# Patient Record
Sex: Female | Born: 1985
Health system: Southern US, Community
[De-identification: ages and names within clinical notes are randomized; demographics above are authoritative.]

## PROBLEM LIST (undated history)

## (undated) DIAGNOSIS — F840 Autistic disorder: Secondary | ICD-10-CM

## (undated) HISTORY — PX: WISDOM TOOTH EXTRACTION: SHX21

## (undated) HISTORY — DX: Autistic disorder: F84.0

---
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
## 2015-12-17 DIAGNOSIS — F840 Autistic disorder: Secondary | ICD-10-CM | POA: Diagnosis not present

## 2015-12-17 DIAGNOSIS — F900 Attention-deficit hyperactivity disorder, predominantly inattentive type: Secondary | ICD-10-CM | POA: Diagnosis not present

## 2016-06-09 DIAGNOSIS — F900 Attention-deficit hyperactivity disorder, predominantly inattentive type: Secondary | ICD-10-CM | POA: Diagnosis not present

## 2016-12-01 DIAGNOSIS — F900 Attention-deficit hyperactivity disorder, predominantly inattentive type: Secondary | ICD-10-CM | POA: Diagnosis not present

## 2017-02-02 DIAGNOSIS — H35413 Lattice degeneration of retina, bilateral: Secondary | ICD-10-CM | POA: Diagnosis not present

## 2017-02-28 DIAGNOSIS — R636 Underweight: Secondary | ICD-10-CM | POA: Diagnosis not present

## 2017-02-28 DIAGNOSIS — F840 Autistic disorder: Secondary | ICD-10-CM | POA: Diagnosis not present

## 2017-02-28 DIAGNOSIS — Z23 Encounter for immunization: Secondary | ICD-10-CM | POA: Diagnosis not present

## 2017-05-04 ENCOUNTER — Ambulatory Visit: Payer: BLUE CROSS/BLUE SHIELD | Admitting: Dietician

## 2017-05-11 ENCOUNTER — Encounter: Payer: BLUE CROSS/BLUE SHIELD | Attending: Family Medicine | Admitting: Dietician

## 2017-05-11 DIAGNOSIS — F840 Autistic disorder: Secondary | ICD-10-CM | POA: Diagnosis not present

## 2017-05-11 DIAGNOSIS — R636 Underweight: Secondary | ICD-10-CM | POA: Insufficient documentation

## 2017-05-11 DIAGNOSIS — Z713 Dietary counseling and surveillance: Secondary | ICD-10-CM | POA: Diagnosis not present

## 2017-05-11 NOTE — Progress Notes (Signed)
Medical Nutrition Therapy:  Appt start time: 1500 end time:  1600.   Assessment:  Primary concerns today: Patient is here today with her mother Regina Vega.  She was referred for underweight.  History includes Autism and possible ADD.  Per MD notes, patient may unconsiously restrict her food intake and feels that she cannot eat starches.  In interview with her patient and mother, there are many foods that Regina Vega has sensitivities to or dislikes due to taste or texture.  These are listed under the Dietary intake section.  States that she eats to get her blood sugar up.  Regina Vega generally eats 1 good meal a day and otherwise snacks.  She seems to have a lot of food rules.  "They don't go together in my body."   Specifics also listed under the dietary intake section of this note.  She sleeps from midnight to 8 am or noon depending on what she is doing.  Appetite may be reduced from the Adderall as well.   Today's weight:  90 lbs (02/28/17- 91.4 lbs).  UBW 90-100 lbs Highest weight 115 lbs.  Patient lives with her mother, father, and brother.  Mom does most of the shopping and parents share cooking.  No specific physical activity.  She walks some.  She is currently going to college part time at [employer] grad school in physics.  Preferred Learning Style:   No preference indicated   Learning Readiness:   Contemplating   MEDICATIONS: see list to include Adderall on days that she goes to college   DIETARY INTAKE:  Sensory issues (gag or odor) for raw tomatoes, peppers, eggplant (smell), cooked carrots, broccoli, beets, cumin, combination of eggs and vinegar, blueberries, many canned, cooked or dried fruits and some foods with strong contrasts such as walnuts in sweets because walnuts are bitter).  "Too many grains cause gas pains and diarrhea from starch"  (rice, potatoes, cornstarch, bread, quinoa,- tolerates bread better than rice), smoothies taste like over-ripe fruit.  Patient repeatedly stated  that "gluten was not the problem".  Mother brought in a list of foods that she eats which includes onions, garlic, spinach, brown rice, quinoa, wild rice all in small quantities, pinapple, eggs, milk, mozerella chees, triscuits, red kidney beans or tofu mashed with flavorings, oranges, pecans, noodles, soy sauce, lemon juice, coconut milk, some chicken, [place] (ground), beef (ground), smooth peanut butter, various vinegars, olive oil, and various spices.  24-hr recall:  B ( AM): "candy or two to get my blood sugar up"-"I don't like to do this usually but hard to get a good balance." OR triscuits  Snk ( AM): candy, peanut butter, cheese, milk, pop sickles, triscuits L ( PM):  snacks Snk ( PM): snacks D ( PM): chicken or salmon, tofu, ground beef or [place], or chick peas or red beans and rice, few vegetables Snk ( PM): snacks Beverages: 2% milk (1-4 cups daily), small amounts of water, occasional juice  Usual physical activity: walking  Estimated energy needs: 2200 calories 60 g protein   Progress Towards Goal(s):  In progress.   Nutritional Diagnosis:  [place]-3.1 Underweight As related to autism food sensitivities j.  As evidenced by diet hx and BMI of 14.    Intervention:  Nutrition counseling/education to maximize nutritional intake.  Discussed adequate hydration and meal schedule.  Discussed meal options as well as supplements to try.  Patient with some resistance.  Aim for 8 cups (64 ounces) fluid daily. Breakfast, lunch, dinner, and 3 snacks daily.  Breakfast or snack ideas:  -Banana bread, whole milk  -Poppyseed muffin, whole milk  -Eggs, cheese (saute onions, tomato sauce, spinach), with avocado  -Yogurt (try to find whole milk yogurt)-lemon, toast with peanut butter  -Triscuits and cheese, fresh fruit  -Banana shake (whole milk, banana, ?peanut butter)  -Whole milk yogurt, pineapple and pecans  Smoothie, Ensure, Boost, other shake.     Teaching Method Utilized:   Visual Auditory Hands on   Barriers to learning/adherence to lifestyle change: autism  Demonstrated degree of understanding via:  Teach Back   Monitoring/Evaluation:  Dietary intake, exercise, and body weight in 4 week(s).

## 2017-05-11 NOTE — Patient Instructions (Addendum)
Aim for 8 cups (64 ounces) fluid daily. Breakfast, lunch, dinner, and 3 snacks daily.  Breakfast or snack ideas:  -Banana bread, whole milk  -Poppyseed muffin, whole milk  -Eggs, cheese (saute onions, tomato sauce, spinach), with avocado  -Yogurt (try to find whole milk yogurt)-lemon, toast with peanut butter  -Triscuits and cheese, fresh fruit  -Banana shake (whole milk, banana, ?peanut butter)  -Whole milk yogurt, pineapple and pecans  Smoothie, Ensure, Boost, other shake.

## 2017-05-30 DIAGNOSIS — Z131 Encounter for screening for diabetes mellitus: Secondary | ICD-10-CM | POA: Diagnosis not present

## 2017-05-30 DIAGNOSIS — Z0000 Encounter for general adult medical examination without abnormal findings: Secondary | ICD-10-CM | POA: Diagnosis not present

## 2017-05-30 DIAGNOSIS — F840 Autistic disorder: Secondary | ICD-10-CM | POA: Diagnosis not present

## 2017-05-30 DIAGNOSIS — Z13220 Encounter for screening for lipoid disorders: Secondary | ICD-10-CM | POA: Diagnosis not present

## 2017-06-08 ENCOUNTER — Ambulatory Visit: Payer: BLUE CROSS/BLUE SHIELD | Admitting: Dietician

## 2017-06-13 ENCOUNTER — Other Ambulatory Visit (HOSPITAL_COMMUNITY)
Admission: RE | Admit: 2017-06-13 | Discharge: 2017-06-13 | Disposition: A | Discharge status: Home / Self Care | Payer: BLUE CROSS/BLUE SHIELD | Source: Ambulatory Visit | Attending: Family Medicine | Admitting: Family Medicine

## 2017-06-13 ENCOUNTER — Other Ambulatory Visit: Payer: Self-pay | Admitting: Family Medicine

## 2017-06-13 DIAGNOSIS — F840 Autistic disorder: Secondary | ICD-10-CM | POA: Diagnosis not present

## 2017-06-13 DIAGNOSIS — Z124 Encounter for screening for malignant neoplasm of cervix: Secondary | ICD-10-CM | POA: Insufficient documentation

## 2017-06-15 ENCOUNTER — Encounter: Payer: BLUE CROSS/BLUE SHIELD | Attending: Family Medicine | Admitting: Dietician

## 2017-06-15 DIAGNOSIS — F840 Autistic disorder: Secondary | ICD-10-CM | POA: Insufficient documentation

## 2017-06-15 DIAGNOSIS — R636 Underweight: Secondary | ICD-10-CM | POA: Insufficient documentation

## 2017-06-15 DIAGNOSIS — Z713 Dietary counseling and surveillance: Secondary | ICD-10-CM | POA: Insufficient documentation

## 2017-06-15 LAB — CYTOLOGY - PAP
Diagnosis: NEGATIVE
HPV: NOT DETECTED

## 2017-06-15 NOTE — Patient Instructions (Signed)
Aim for 8 cups (64 ounces) fluid daily. Breakfast, lunch, dinner, and 3 snacks daily.             Breakfast or snack ideas:             -Banana bread, whole milk             -Poppyseed muffin, whole milk             -Eggs, cheese (saute onions, tomato sauce, spinach), with avocado             -Yogurt (try to find whole milk yogurt)-lemon, toast with peanut butter             -Triscuits and cheese, fresh fruit             -Banana shake (whole milk, banana, ?peanut butter)             -Whole milk yogurt, pineapple and pecans  -Quiche  Try a supplement and have 2-3 daily  Boost  Ensure  [employer] Breakfast Essentials  Consider eating every 2-3 hours (snack or meal)  Avocado pudding ([place] [email or web address])

## 2017-06-15 NOTE — Progress Notes (Signed)
Medical Nutrition Therapy:  Appt start time: 1515 end time:  1550.   Assessment:  Primary concerns today: Patient is here today alone with her mother Karen KitchensBobbie for follow up for underweight.  Her weight is unchanged.  They have experimented some with new foods but remains a challenge.  She has drank the whole milk and likes this but she reports being full for longer.  She continues to report a problem with starch utilization.  Still eating to get her blood sugar up.  States that peanut butter and nuts make her blood sugar crash.  She continues on Adderall but denies that this is negatively effecting her appetite.  She takes this at about noon on days that she has class.  Continues to eat 1 good meal per day plus snacks.   Today's weight:  90 lbs (02/28/17- 91.4 lbs).  UBW 90-100 lbs Highest weight 115 lbs.  Patient lives with her mother, father, and brother.  Mom does most of the shopping and parents share cooking.  No specific physical activity.  She walks some.  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  Preferred Learning Style:   No preference indicated   Learning Readiness:   Ready  Change in progress   MEDICATIONS: see list to include Adderall on days that she goes to college   DIETARY INTAKE: Sensory issues (gag or odor) for raw tomatoes, peppers, eggplant (smell), cooked carrots, broccoli, beets, cumin, combination of eggs and vinegar, blueberries, many canned, cooked or dried fruits and some foods with strong contrasts such as walnuts in sweets because walnuts are bitter).  "Too many grains cause gas pains and diarrhea from starch"  (rice, potatoes, cornstarch, bread, quinoa,- tolerates bread better than rice), smoothies taste like over-ripe fruit.  Patient repeatedly stated that "gluten was not the problem".  Mother brought in a list of foods that she eats which includes onions, garlic, spinach, brown rice, quinoa, wild rice all in small  quantities, pinapple, eggs, milk, mozerella chees, triscuits, red kidney beans or tofu mashed with flavorings, oranges, pecans, noodles, soy sauce, lemon juice, coconut milk, some chicken, [place] (ground), beef (ground), smooth peanut butter, various vinegars, olive oil, and various spices.  24-hr recall: see recall from 12/27 visit   Usual physical activity: walking  Estimated energy needs: 2200 calories 60-75 g protein  Progress Towards Goal(s):  In progress.   Nutritional Diagnosis:  [place]-3.1 Underweight As related to autism food sensitivities.  As evidenced by diet history and BMI of 14.    Intervention:  Nutrition counseling/education continued to maximize nutritional intake. Again discussed importance of adequate hydration and meal schedule.  Maralyn SagoSarah states that she does not like schedules.  She often uses the word dubious (doesn't know enough about it).  Gave a sample shake supplement for her to try. She was more relaxed at this visit and willing to discuss ideas for further meal plans and snacks.  Further discussed her food intolerances and questioned whether evaluation of these with further testing would be helpful.  Messaged another RD about this that deals with food sensitivities.  Aim for 8 cups (64 ounces) fluid daily. Breakfast, lunch, dinner, and 3 snacks daily.             Breakfast or snack ideas:             -Banana bread, whole milk             -Poppyseed muffin, whole milk             -  Eggs, cheese (saute onions, tomato sauce, spinach), with avocado             -Yogurt (try to find whole milk yogurt)-lemon, toast with peanut butter             -Triscuits and cheese, fresh fruit             -Banana shake (whole milk, banana, ?peanut butter)             -Whole milk yogurt, pineapple and pecans  -Quiche  Try a supplement and have 2-3 daily  Boost  Ensure  [employer] Essentials  Consider eating every 2-3 hours (snack or meal)  Avocado pudding ([place]  [email or web address])  Teaching Method Utilized:  Visual Auditory Hands on  Handouts given during visit include:  Snack list  Barriers to learning/adherence to lifestyle change: autism  Demonstrated degree of understanding via:  Teach Back   Monitoring/Evaluation:  Dietary intake, exercise, and body weight in 2 month(s).

## 2017-07-13 DIAGNOSIS — F900 Attention-deficit hyperactivity disorder, predominantly inattentive type: Secondary | ICD-10-CM | POA: Diagnosis not present

## 2017-08-03 ENCOUNTER — Telehealth: Payer: Self-pay | Admitting: Dietician

## 2017-08-03 NOTE — Telephone Encounter (Signed)
Left message on home answering service related to requested appointment change per mother's request. Appointment next week has been cancelled. New appointment May 30th at 4:00.  Oran ReinLaura Sharonica Kraszewski, RD, LDN, CDE

## 2017-08-10 ENCOUNTER — Ambulatory Visit: Payer: BLUE CROSS/BLUE SHIELD | Admitting: Dietician

## 2017-10-12 ENCOUNTER — Encounter: Payer: BLUE CROSS/BLUE SHIELD | Attending: Family Medicine | Admitting: Dietician

## 2017-10-12 DIAGNOSIS — E639 Nutritional deficiency, unspecified: Secondary | ICD-10-CM | POA: Diagnosis not present

## 2017-10-12 DIAGNOSIS — Z713 Dietary counseling and surveillance: Secondary | ICD-10-CM | POA: Diagnosis not present

## 2017-10-12 DIAGNOSIS — R636 Underweight: Secondary | ICD-10-CM

## 2017-10-12 NOTE — Patient Instructions (Addendum)
Continue to experiment. Great adding breakfast! Add snacks between breakfast and lunch   Kind Protein bars  [employer], date, honey bars  Granola Bar  ([facility] Honey)  Spanikopita ([employer])  Spinach dip or hummus and carrots or pita chips or corn chips.  Banana bread, pumpkin bread, lemon poppyseed muffin etc. Continue the Carnation Breakfast in milk.  Aim for 1 packet per day.  Breakfast  Afternoon snack (1-2)  Before bed Aim for 8 cups fluid (64 ounces) daily.

## 2017-10-13 ENCOUNTER — Encounter: Payer: Self-pay | Admitting: Dietician

## 2017-10-13 NOTE — Progress Notes (Signed)
Medical Nutrition Therapy:  Appt start time: 1600 end time:  1630.   Assessment:  Primary concerns today: Patient is here today with her mother Karen Kitchens for follow up for underweight.  Her weight has increased 2 lbs since her last visit 06/15/17.  They have continued to experiment with foods and have found a few more items that she is willing to eat.  She eats well for dinner which is generally around 9 pm.  She now eating a little better at breakfast and has found a few snacks that she is willing to eat.  She will also drink 1/3 package Carnation Breakfast in 8 ounces of whole milk.  She reports continued intolerance (diarrhea) when she eats too many grains and reports that she tolerates wheat best.  She takes Adderall 3 days per week when she attends college and does not notice any effect on her appetite but while at school, food is less available and she does not bring anything or minimal.    Today's weight 92 lbs (90 lbs 06/15/17).   UBW 90-100 lbs Highest weight 115 lbs.  Patient lives with her mother, father, and brother who also has autism.  Mom does most of the shopping and parents share cooking.  No specific physical activity although she walks some.  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    Preferred Learning Style:   No preference indicated   Learning Readiness:   Ready  Change in progress   MEDICATIONS: Adderall 3 days per week.   DIETARY INTAKE: Sensory issues (gag or odor) for raw tomatoes, peppers, eggplant (smell), cooked carrots, broccoli, beets, cumin, combination of eggs and vinegar, blueberries, many canned, cooked or dried fruits and some foods with strong contrasts such as walnuts in sweets because walnuts are bitter). "Too many grains cause gas pains and diarrhea from starch" (rice, potatoes, cornstarch, bread, quinoa,- tolerates bread better than rice), smoothies taste like over-ripe fruit. Patient repeatedly stated that  "gluten was not the problem".  Mother brought in a list of foods that she eats which includes onions, garlic, spinach, brown rice, quinoa, wild rice all in small quantities, pinapple, eggs, milk, mozerella chees, triscuits, red kidney beans or tofu mashed with flavorings, oranges, pecans, noodles, soy sauce, lemon juice, coconut milk, some chicken, [place] (ground), beef (ground), smooth peanut butter, various vinegars, olive oil, and various spices.  She eats very slowly (picks little parts of food apart).  Complains of early satiety.  If she eats too quickly or too much, she complains of not feeling well.  24-hr recall:  B ( AM): 1/4 or a single serving quiche and 8 ounces whole milk with 1/3 package of [employer] (states that she gets full from this and is unable to eat more).    Snk ( AM): [employer] or seed, date nut bar, or banana or pumpkin bread or smoothie, or granola bar L ( PM): none Snk ( PM): snacks such as am and 8 ounces milk with Carnation Breakfast (1/3 pack).  Candy D ( PM): Meat, milk, vegetables (mom and patient state dinner is her best meal and she eats this well Snk ( PM): none Beverages: whole milk with Carnation Breakfast (1-4 cups daily), small amounts of water, occasional juice  Usual physical activity: walking  Estimated energy needs: 2200 calories 60 g protein  Progress Towards Goal(s):  In progress.   Nutritional Diagnosis:  -3.1 Underweight As related to Autism food sensitivities.  As evidenced by BMI of 14.    Intervention:  Nutrition counseling/education to maximize nutritional intake.  Continued to discuss nutritional needs and frequent eating of high nutritional quality.  Continued to discuss meal/snack ideas.  Previously discussed patient with another RD that stated food tests regarding sensitivity would not be recommended at this time.  Continue to experiment. Great adding breakfast! Add snacks between breakfast and lunch   Kind Protein  bars  [employer], date, honey bars  Granola Bar  ([facility] Honey)  Spanikopita ([employer])  Spinach dip or hummus and carrots or pita chips or corn chips.  Banana bread, pumpkin bread, lemon poppyseed muffin etc. Continue the Carnation Breakfast in milk.  Aim for 1 packet per day.  Breakfast  Afternoon snack (1-2)  Before bed Aim for 8 cups fluid (64 ounces) daily.  Teaching Method Utilized:  Visual Auditory  Barriers to learning/adherence to lifestyle change: autism  Demonstrated degree of understanding via:  Teach Back   Monitoring/Evaluation:  Dietary intake, exercise, and body weight in 3 month(s).

## 2018-01-04 DIAGNOSIS — F900 Attention-deficit hyperactivity disorder, predominantly inattentive type: Secondary | ICD-10-CM | POA: Diagnosis not present

## 2018-01-04 DIAGNOSIS — F840 Autistic disorder: Secondary | ICD-10-CM | POA: Diagnosis not present

## 2018-01-25 ENCOUNTER — Encounter: Payer: BLUE CROSS/BLUE SHIELD | Attending: Family Medicine | Admitting: Dietician

## 2018-01-25 DIAGNOSIS — Z713 Dietary counseling and surveillance: Secondary | ICD-10-CM | POA: Insufficient documentation

## 2018-01-25 DIAGNOSIS — R636 Underweight: Secondary | ICD-10-CM | POA: Diagnosis not present

## 2018-01-25 NOTE — Patient Instructions (Signed)
Have several meals and snacks per day. Continue to challenge yourself to eat a little more Be sure to eat even when not hungry.  Your body needs nutrition Try whole milk yogurt Consider going to the bulk section of a store and looking at the snacks and trying new things (banana chips, sesame sticks, yogurt covered items etc.)

## 2018-01-25 NOTE — Progress Notes (Signed)
Medical Nutrition Therapy:  Appt start time: 1630 end time:  1700.   Assessment:  Primary concerns today: Patient is here today with her mother Regina Vega.  She was last seen in May.  Her weight continues to be unchanged at 91 lbs.  Regina Vega is getting bored with food variety.  She continues to eat dinner which is more balanced and Regina Vega agrees to this but struggles with meals at other times of the day and generally eats a very small breakfast and snacks.  She states that she would like something to eat rather than candy to give her energy but struggles to find something that she likes that she feels that she can digest well.  She continues to report diarrhea when she eats too many grains and reports that she tolerates wheat best.  She takes Adderall and continues to attend college 3 days per week and does not bring significant food.  She has stopped drinking the [employer]arn[employer] Breakfast but continues to use the whole milk and also enjoys Belvita Protein Oats Honey and Chocolate.  She states that if the food takes works she will eat less of it.  She looks into the refrigerator and only sees what she does not like.  She wants to gain increased independence but now is asking her mom to help her with meals and snacks and have independence in other areas.    UBW 90-100 lbs Highest weigh 115 lbs  Regina Vega lives with her mother, father, and brother who also has autism.  Mom does most of the shopping and encouraged patient to shop with her.  No specific physical activity although she walks some.  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    Preferred Learning Style:   No preference indicated   Learning Readiness:   Contemplating  Change in progress   DIETARY INTAKE:  24-hr recall:  B ( AM): 1/4 small quiche, whole milk or smoothie   Snk ( AM): quick bread  L ( PM): snack Snk ( PM): snack D ( PM): balanced Snk ( PM): none Beverages: whole milk, smoothie, small  amounts of water, occasional juice  Usual physical activity: walking  Estimated energy needs: 2200 calories 60 g protein  Progress Towards Goal(s):  In progress.   Nutritional Diagnosis:  [place]-3.1 Underweight As related to Autism/ food intolerances and dislikes.  As evidenced by BMI 14.    Intervention:  Nutrition counseling/education continued.  Readdressed the goals with patient and need to gain weight and improve overall nutrition.  Mom stated need to make sure Regina Vega is healthy.  Reviewed nutrition and physiology and the need to improve.  Discussed food options and foods to increase variety, calories, Discussed several small meals and snacks as patient states that she feels ill after overeating (normal portions).    Have several meals and snacks per day. Continue to challenge yourself to eat a little more Be sure to eat even when not hungry.  Your body needs nutrition Try whole milk yogurt Consider going to the bulk section of a store and looking at the snacks and trying new things (banana chips, sesame sticks, yogurt covered items etc.)  Patient and mom to work on a list of snacks that are available to put on the refrigerator to prompt choices as well as mom to assist more with meals and snacks.  Teaching Method Utilized:  Visual Auditory  Handouts given during visit include:  Meal plan card  Snack list  Barriers to learning/adherence to lifestyle change: autism  Demonstrated degree of understanding via:  Teach Back   Monitoring/Evaluation:  Dietary intake, exercise, and body weight in 2 month(s).

## 2018-02-15 DIAGNOSIS — H35413 Lattice degeneration of retina, bilateral: Secondary | ICD-10-CM | POA: Diagnosis not present

## 2018-03-29 ENCOUNTER — Ambulatory Visit: Payer: BLUE CROSS/BLUE SHIELD | Admitting: Dietician

## 2018-04-19 ENCOUNTER — Ambulatory Visit: Payer: BLUE CROSS/BLUE SHIELD | Admitting: Dietician

## 2018-05-07 ENCOUNTER — Ambulatory Visit: Payer: BLUE CROSS/BLUE SHIELD | Admitting: Dietician

## 2018-05-24 DIAGNOSIS — F4320 Adjustment disorder, unspecified: Secondary | ICD-10-CM | POA: Diagnosis not present

## 2018-05-31 ENCOUNTER — Ambulatory Visit: Payer: BLUE CROSS/BLUE SHIELD | Admitting: Dietician

## 2018-05-31 DIAGNOSIS — F4320 Adjustment disorder, unspecified: Secondary | ICD-10-CM | POA: Diagnosis not present

## 2018-06-05 DIAGNOSIS — Z131 Encounter for screening for diabetes mellitus: Secondary | ICD-10-CM | POA: Diagnosis not present

## 2018-06-05 DIAGNOSIS — Z13220 Encounter for screening for lipoid disorders: Secondary | ICD-10-CM | POA: Diagnosis not present

## 2018-06-05 DIAGNOSIS — R636 Underweight: Secondary | ICD-10-CM | POA: Diagnosis not present

## 2018-06-05 DIAGNOSIS — Z0001 Encounter for general adult medical examination with abnormal findings: Secondary | ICD-10-CM | POA: Diagnosis not present

## 2018-06-07 ENCOUNTER — Ambulatory Visit: Payer: BLUE CROSS/BLUE SHIELD | Admitting: Dietician

## 2018-06-07 DIAGNOSIS — F4320 Adjustment disorder, unspecified: Secondary | ICD-10-CM | POA: Diagnosis not present

## 2018-06-14 DIAGNOSIS — F411 Generalized anxiety disorder: Secondary | ICD-10-CM | POA: Diagnosis not present

## 2018-06-21 DIAGNOSIS — F4320 Adjustment disorder, unspecified: Secondary | ICD-10-CM | POA: Diagnosis not present

## 2018-06-28 DIAGNOSIS — M6740 Ganglion, unspecified site: Secondary | ICD-10-CM | POA: Diagnosis not present

## 2018-07-12 DIAGNOSIS — F900 Attention-deficit hyperactivity disorder, predominantly inattentive type: Secondary | ICD-10-CM | POA: Diagnosis not present

## 2018-07-19 DIAGNOSIS — F411 Generalized anxiety disorder: Secondary | ICD-10-CM | POA: Diagnosis not present

## 2018-07-26 DIAGNOSIS — F411 Generalized anxiety disorder: Secondary | ICD-10-CM | POA: Diagnosis not present

## 2018-08-02 ENCOUNTER — Encounter: Payer: BLUE CROSS/BLUE SHIELD | Attending: Endocrinology | Admitting: Dietician

## 2018-08-02 ENCOUNTER — Other Ambulatory Visit: Payer: Self-pay

## 2018-08-02 DIAGNOSIS — R636 Underweight: Secondary | ICD-10-CM

## 2018-08-02 DIAGNOSIS — F411 Generalized anxiety disorder: Secondary | ICD-10-CM | POA: Diagnosis not present

## 2018-08-02 NOTE — Patient Instructions (Signed)
See the list of recipe ideas and tips to increase calories. Ideas include Alfredo sauce, peanut sauce, seasoned nuts. Add some cream to your smoothie.  Several small meals per day. Avoid skipping meals Add butter, oil to foods.

## 2018-08-02 NOTE — Progress Notes (Signed)
Medical Nutrition Therapy:  Appt start time: 1515 end time:  1550.   Assessment:  Primary concerns today: Patient is here today with her mother Karen Kitchens.  She would like to know how to get more fat that is not candy.  She is no longer taking Adderall.  She felt that she no longer needed it and stopped taking this.  She was only taking this on days that she was going to school.  She states that the Adderall tended to make her "blood sugar crash".   Weight has increased 2 lbs to 93.4 lbs today.  She was last seen 6 months ago. UBW 90-100 lbs Highest weight 115 lbs  She is now living in an apartment with her brother and they are preparing meals.  Mom delivers groceries and also prepares food and brings it.  Masen also is working with her Clinical biochemist on cooking.  She is interested in recipes as well (particularly for entrees and side dishes).  Her brother does not want any internet at the apartment so she gets bored often. Currently school is on hold due to [place] Virus restrictions.  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     Preferred Learning Style:   No preference indicated   Learning Readiness:   Contemplating  Change in progress  MEDICATIONS: see list   DIETARY INTAKE:  Usual eating pattern includes 3 meals and 1-3 snacks per day.  24-hr recall:  Sensory issues (gag or odor) for raw tomatoes, peppers, eggplant (smell), cooked carrots, broccoli, beets, cumin, combination of eggs and vinegar, blueberries, many canned, cooked or dried fruits and some foods with strong contrasts such as walnuts in sweets because walnuts are bitter). "Too many grains cause gas pains and diarrhea from starch" (rice, potatoes, cornstarch, bread, quinoa,- tolerates bread better than rice), smoothies taste like over-ripe fruit. Patient repeatedly stated that "gluten was not the problem", mayo  Mother brought in a list of foods that she eats  which includes onions, garlic, spinach, brown rice, quinoa, wild rice all in small quantities, pinapple, eggs, milk, mozerella chees, triscuits, red kidney beans or tofu mashed with flavorings, oranges, pecans, noodles, soy sauce, lemon juice, coconut milk, some chicken, [place] (ground), beef (ground), smooth peanut butter, various vinegars, olive oil, and various spices.  B ( AM): 1/4 small Quiche,   Snk ( AM): occasional banana bread  L ( PM): spanikopita at times or skips Snk ( PM): candy or smoothie cube or date, seed, nut bars D ( PM): working with her autism coach on cooking- Administrator, arts with butter and sugar OR chicken Snk ( PM): fruit pops Beverages: 2 cups whole milk, cran grape juice, dislikes water, smoothie ice cubes  Usual physical activity: walking  Estimated energy needs: 2200 calories 60 g protein  Progress Towards Goal(s):  In progress.   Nutritional Diagnosis:  [place]-3.1 Underweight As related to Autism/food intelerances and dislikes.  As evidenced by diet hx and patient report.    Intervention:  Nutrition counseling/education continued.  Discussed foods high in fat and ideas to incorporate these throughout the day.  Discussed the need to avoid skipping meals and reviewed appropriate meal planning.  Patient continues to eat very small portions and encouraged her to increase quality as able. Will send recipes based on her preferences.  She remains resistant to change but is more willing this visit to incorporate some ideas.  See the list of  recipe ideas and tips to increase calories. Ideas include Alfredo sauce, peanut sauce, seasoned nuts. Add some cream to your smoothie. Several small meals per day. Avoid skipping meals Add butter, oil to foods.  Teaching Method Utilized:  Visual Auditory Hands on  Handouts given during visit include:  High protein, high calorie recipe ideas  Underweight nutrition therapy  Barriers to learning/adherence to lifestyle  change: Autism  Demonstrated degree of understanding via:  Teach Back   Monitoring/Evaluation:  Dietary intake, exercise, and body weight prn.

## 2018-08-03 ENCOUNTER — Encounter: Payer: Self-pay | Admitting: Dietician

## 2018-08-09 DIAGNOSIS — F411 Generalized anxiety disorder: Secondary | ICD-10-CM | POA: Diagnosis not present

## 2018-08-16 DIAGNOSIS — F411 Generalized anxiety disorder: Secondary | ICD-10-CM | POA: Diagnosis not present

## 2018-08-22 DIAGNOSIS — F411 Generalized anxiety disorder: Secondary | ICD-10-CM | POA: Diagnosis not present

## 2018-08-23 DIAGNOSIS — F411 Generalized anxiety disorder: Secondary | ICD-10-CM | POA: Diagnosis not present

## 2018-08-29 DIAGNOSIS — F411 Generalized anxiety disorder: Secondary | ICD-10-CM | POA: Diagnosis not present

## 2018-08-30 DIAGNOSIS — F411 Generalized anxiety disorder: Secondary | ICD-10-CM | POA: Diagnosis not present

## 2018-09-05 DIAGNOSIS — F411 Generalized anxiety disorder: Secondary | ICD-10-CM | POA: Diagnosis not present

## 2018-09-06 DIAGNOSIS — F411 Generalized anxiety disorder: Secondary | ICD-10-CM | POA: Diagnosis not present

## 2018-09-12 DIAGNOSIS — F411 Generalized anxiety disorder: Secondary | ICD-10-CM | POA: Diagnosis not present

## 2018-09-13 DIAGNOSIS — F411 Generalized anxiety disorder: Secondary | ICD-10-CM | POA: Diagnosis not present

## 2018-09-20 DIAGNOSIS — F411 Generalized anxiety disorder: Secondary | ICD-10-CM | POA: Diagnosis not present

## 2018-09-21 DIAGNOSIS — F411 Generalized anxiety disorder: Secondary | ICD-10-CM | POA: Diagnosis not present

## 2018-09-24 DIAGNOSIS — F411 Generalized anxiety disorder: Secondary | ICD-10-CM | POA: Diagnosis not present

## 2018-09-26 DIAGNOSIS — F411 Generalized anxiety disorder: Secondary | ICD-10-CM | POA: Diagnosis not present

## 2018-10-03 DIAGNOSIS — F411 Generalized anxiety disorder: Secondary | ICD-10-CM | POA: Diagnosis not present

## 2018-10-04 DIAGNOSIS — F411 Generalized anxiety disorder: Secondary | ICD-10-CM | POA: Diagnosis not present

## 2018-10-11 DIAGNOSIS — F411 Generalized anxiety disorder: Secondary | ICD-10-CM | POA: Diagnosis not present

## 2018-10-12 DIAGNOSIS — F411 Generalized anxiety disorder: Secondary | ICD-10-CM | POA: Diagnosis not present

## 2018-10-17 DIAGNOSIS — F411 Generalized anxiety disorder: Secondary | ICD-10-CM | POA: Diagnosis not present

## 2018-10-18 DIAGNOSIS — F411 Generalized anxiety disorder: Secondary | ICD-10-CM | POA: Diagnosis not present

## 2018-10-25 DIAGNOSIS — F411 Generalized anxiety disorder: Secondary | ICD-10-CM | POA: Diagnosis not present

## 2018-10-26 DIAGNOSIS — F411 Generalized anxiety disorder: Secondary | ICD-10-CM | POA: Diagnosis not present

## 2018-10-31 DIAGNOSIS — F411 Generalized anxiety disorder: Secondary | ICD-10-CM | POA: Diagnosis not present

## 2018-11-01 DIAGNOSIS — F411 Generalized anxiety disorder: Secondary | ICD-10-CM | POA: Diagnosis not present

## 2018-11-08 DIAGNOSIS — F411 Generalized anxiety disorder: Secondary | ICD-10-CM | POA: Diagnosis not present

## 2018-11-15 DIAGNOSIS — F411 Generalized anxiety disorder: Secondary | ICD-10-CM | POA: Diagnosis not present

## 2018-11-22 DIAGNOSIS — F411 Generalized anxiety disorder: Secondary | ICD-10-CM | POA: Diagnosis not present

## 2018-11-29 DIAGNOSIS — F411 Generalized anxiety disorder: Secondary | ICD-10-CM | POA: Diagnosis not present

## 2018-12-06 DIAGNOSIS — F411 Generalized anxiety disorder: Secondary | ICD-10-CM | POA: Diagnosis not present

## 2018-12-13 DIAGNOSIS — F411 Generalized anxiety disorder: Secondary | ICD-10-CM | POA: Diagnosis not present

## 2018-12-27 DIAGNOSIS — F411 Generalized anxiety disorder: Secondary | ICD-10-CM | POA: Diagnosis not present

## 2019-01-03 DIAGNOSIS — F411 Generalized anxiety disorder: Secondary | ICD-10-CM | POA: Diagnosis not present

## 2019-01-10 DIAGNOSIS — F411 Generalized anxiety disorder: Secondary | ICD-10-CM | POA: Diagnosis not present

## 2019-02-07 DIAGNOSIS — F411 Generalized anxiety disorder: Secondary | ICD-10-CM | POA: Diagnosis not present

## 2019-02-14 DIAGNOSIS — F411 Generalized anxiety disorder: Secondary | ICD-10-CM | POA: Diagnosis not present

## 2019-02-21 DIAGNOSIS — H35413 Lattice degeneration of retina, bilateral: Secondary | ICD-10-CM | POA: Diagnosis not present

## 2019-02-28 DIAGNOSIS — F411 Generalized anxiety disorder: Secondary | ICD-10-CM | POA: Diagnosis not present

## 2019-03-08 ENCOUNTER — Encounter (HOSPITAL_COMMUNITY): Payer: Self-pay

## 2019-03-08 ENCOUNTER — Other Ambulatory Visit: Payer: Self-pay

## 2019-03-08 DIAGNOSIS — R1031 Right lower quadrant pain: Secondary | ICD-10-CM | POA: Diagnosis not present

## 2019-03-08 DIAGNOSIS — R109 Unspecified abdominal pain: Secondary | ICD-10-CM | POA: Diagnosis not present

## 2019-03-08 DIAGNOSIS — N83291 Other ovarian cyst, right side: Secondary | ICD-10-CM | POA: Diagnosis not present

## 2019-03-08 DIAGNOSIS — R112 Nausea with vomiting, unspecified: Secondary | ICD-10-CM | POA: Diagnosis not present

## 2019-03-08 DIAGNOSIS — R1110 Vomiting, unspecified: Secondary | ICD-10-CM | POA: Diagnosis not present

## 2019-03-08 DIAGNOSIS — N83201 Unspecified ovarian cyst, right side: Secondary | ICD-10-CM | POA: Diagnosis not present

## 2019-03-08 NOTE — ED Triage Notes (Addendum)
Pt coming from home c/o right lower abdominal pain that is constant. 2 ibuprofen taken. N but not V.  Has appendix. Seen at [facility] and told to come if pain get worse. Pt also have difficulty with telling when she needs to urinate. Blood and urine collected at [facility]

## 2019-03-09 ENCOUNTER — Encounter (HOSPITAL_COMMUNITY): Payer: Self-pay

## 2019-03-09 ENCOUNTER — Emergency Department (HOSPITAL_COMMUNITY): Payer: BC Managed Care – PPO

## 2019-03-09 ENCOUNTER — Emergency Department (HOSPITAL_COMMUNITY)
Admission: EM | Admit: 2019-03-09 | Discharge: 2019-03-09 | Disposition: A | Discharge status: Home / Self Care | Payer: BC Managed Care – PPO | Attending: Emergency Medicine | Admitting: Emergency Medicine

## 2019-03-09 DIAGNOSIS — R1031 Right lower quadrant pain: Secondary | ICD-10-CM | POA: Diagnosis not present

## 2019-03-09 DIAGNOSIS — R1110 Vomiting, unspecified: Secondary | ICD-10-CM | POA: Diagnosis not present

## 2019-03-09 DIAGNOSIS — N83291 Other ovarian cyst, right side: Secondary | ICD-10-CM | POA: Diagnosis not present

## 2019-03-09 DIAGNOSIS — R112 Nausea with vomiting, unspecified: Secondary | ICD-10-CM | POA: Diagnosis not present

## 2019-03-09 DIAGNOSIS — N83201 Unspecified ovarian cyst, right side: Secondary | ICD-10-CM

## 2019-03-09 LAB — URINALYSIS, ROUTINE W REFLEX MICROSCOPIC
Bilirubin Urine: NEGATIVE
Glucose, UA: NEGATIVE mg/dL
Hgb urine dipstick: NEGATIVE
Ketones, ur: 20 mg/dL — AB
Nitrite: NEGATIVE
Protein, ur: NEGATIVE mg/dL
Specific Gravity, Urine: 1.028 (ref 1.005–1.030)
pH: 5 (ref 5.0–8.0)

## 2019-03-09 LAB — I-STAT CHEM 8, ED
BUN: 14 mg/dL (ref 6–20)
Calcium, Ion: 1.28 mmol/L (ref 1.15–1.40)
Chloride: 107 mmol/L (ref 98–111)
Creatinine, Ser: 0.5 mg/dL (ref 0.44–1.00)
Glucose, Bld: 75 mg/dL (ref 70–99)
HCT: 38 % (ref 36.0–46.0)
Hemoglobin: 12.9 g/dL (ref 12.0–15.0)
Potassium: 3.4 mmol/L — ABNORMAL LOW (ref 3.5–5.1)
Sodium: 142 mmol/L (ref 135–145)
TCO2: 24 mmol/L (ref 22–32)

## 2019-03-09 LAB — I-STAT BETA HCG BLOOD, ED (MC, WL, AP ONLY): I-stat hCG, quantitative: <5 m[IU]/mL (ref <5)

## 2019-03-09 LAB — CBC WITH DIFFERENTIAL/PLATELET
Abs Immature Granulocytes: 0.02 10*3/uL (ref 0.00–0.07)
Basophils Absolute: 0.1 10*3/uL (ref 0.0–0.1)
Basophils Relative: 1 %
Eosinophils Absolute: 0.1 10*3/uL (ref 0.0–0.5)
Eosinophils Relative: 1 %
HCT: 37 % (ref 36.0–46.0)
Hemoglobin: 11.5 g/dL — ABNORMAL LOW (ref 12.0–15.0)
Immature Granulocytes: 0 %
Lymphocytes Relative: 43 %
Lymphs Abs: 3.1 10*3/uL (ref 0.7–4.0)
MCH: 26.6 pg (ref 26.0–34.0)
MCHC: 31.1 g/dL (ref 30.0–36.0)
MCV: 85.6 fL (ref 80.0–100.0)
Monocytes Absolute: 0.7 10*3/uL (ref 0.1–1.0)
Monocytes Relative: 9 %
Neutro Abs: 3.3 10*3/uL (ref 1.7–7.7)
Neutrophils Relative %: 46 %
Platelets: 220 10*3/uL (ref 150–400)
RBC: 4.32 MIL/uL (ref 3.87–5.11)
RDW: 13.7 % (ref 11.5–15.5)
WBC: 7.2 10*3/uL (ref 4.0–10.5)
nRBC: 0 % (ref 0.0–0.2)

## 2019-03-09 LAB — HEPATIC FUNCTION PANEL
ALT: 12 U/L (ref 0–44)
AST: 13 U/L — ABNORMAL LOW (ref 15–41)
Albumin: 4.8 g/dL (ref 3.5–5.0)
Alkaline Phosphatase: 47 U/L (ref 38–126)
Bilirubin, Direct: 0.1 mg/dL (ref 0.0–0.2)
Indirect Bilirubin: 0.6 mg/dL (ref 0.3–0.9)
Total Bilirubin: 0.7 mg/dL (ref 0.3–1.2)
Total Protein: 7.4 g/dL (ref 6.5–8.1)

## 2019-03-09 MED ORDER — SODIUM CHLORIDE (PF) 0.9 % IJ SOLN
INTRAMUSCULAR | Status: AC
  Filled 2019-03-09: qty 50

## 2019-03-09 MED ORDER — IOHEXOL 300 MG/ML  SOLN
100.0000 mL | Freq: Once | INTRAMUSCULAR | Status: AC | PRN
  Administered 2019-03-09: 80 mL via INTRAVENOUS

## 2019-03-09 MED ORDER — HYDROCODONE-ACETAMINOPHEN 5-325 MG PO TABS: 1.0000 | ORAL_TABLET | Freq: Four times a day (QID) | ORAL | 0 refills | Status: DC | PRN

## 2019-03-09 MED ORDER — FENTANYL CITRATE (PF) 100 MCG/2ML IJ SOLN
50.0000 ug | Freq: Once | INTRAMUSCULAR | Status: AC
  Administered 2019-03-09: 50 ug via INTRAVENOUS
  Filled 2019-03-09: qty 2

## 2019-03-09 MED ORDER — IBUPROFEN 600 MG PO TABS: 600.0000 mg | ORAL_TABLET | Freq: Four times a day (QID) | ORAL | 0 refills | Status: AC | PRN

## 2019-03-09 NOTE — ED Notes (Signed)
Urine culture sent down to lab with urinalysis. 

## 2019-03-09 NOTE — Discharge Instructions (Addendum)
Take ibuprofen every 6 hours as needed for your pain.  You can take Tylenol as prescribed over-the-counter in alternation with ibuprofen as well.  For severe, breakthrough pain, you can take 1-2 Vicodin every 6 hours.  You can use a heating pad on your abdomen 3-4 times daily alternating 20 minutes on, 20 minutes off.  This can be helpful for the pain.  Please follow-up with your doctor for recheck in the next 1 to 2 weeks.  Please follow-up with your OB/GYN or one of the OB/GYN's below for repeat ultrasound in 6 to 12 weeks to ensure resolution of your ovarian cyst.  A urine culture will be sent of your urine and you will be called if an antibiotic needs to be initiated.  You have opted to wait for this to begin treatment, which I feel is reasonable. Please return to the emergency department if you develop severe, worsening pain not resolving with the medication you are taking, intractable vomiting, fevers, severe back pain, or any other concerning symptoms.    Do not drink alcohol, drive, operate machinery or participate in any other potentially dangerous activities while taking opiate pain medication as it may make you sleepy. Do not take this medication with any other sedating medications, either prescription or over-the-counter. If you were prescribed Percocet or Vicodin, do not take these with acetaminophen (Tylenol) as it is already contained within these medications and overdose of Tylenol is dangerous.   This medication is an opiate (or narcotic) pain medication and can be habit forming.  Use it as little as possible to achieve adequate pain control.  Do not use or use it with extreme caution if you have a history of opiate abuse or dependence. This medication is intended for your use only - do not give any to anyone else and keep it in a secure place where nobody else, especially children, have access to it. It will also cause or worsen constipation, so you may want to consider taking an  over-the-counter stool softener while you are taking this medication.

## 2019-03-09 NOTE — ED Provider Notes (Signed)
[facility]-EMERGENCY DEPT Provider Note   CSN: [id number] Arrival date & time: 03/08/19  2234     History   Chief Complaint Chief Complaint  Patient presents with   Abdominal Pain    HPI Regina Vega is a 33 y.o. female with history of autism who presents with a 2-day history of right lower quadrant pain.  She describes the pain is sharp, but changing in character.  She has had some associated nausea, but no vomiting.  She denies any fever, shortness of breath, cough.  She has had some decreased urination.  She denies any abnormal vaginal bleeding or discharge.  She is not sexually active.  Patient has taken ibuprofen at home with some relief.  Patient was seen by her primary care provider yesterday and reportedly had blood in her urine.     HPI  Past Medical History:  Diagnosis Date   Autism     There are no active problems to display for this patient.   History reviewed. No pertinent surgical history.   OB History   No obstetric history on file.      Home Medications    Prior to Admission medications   Medication Sig Start Date End Date Taking? Authorizing Provider  HYDROcodone-acetaminophen (NORCO/VICODIN) 5-325 MG tablet Take 1-2 tablets by mouth every 6 (six) hours as needed for severe pain. 03/09/19   Hoang Pettingill, Waylan Boga, PA-C  ibuprofen (ADVIL) 600 MG tablet Take 1 tablet (600 mg total) by mouth every 6 (six) hours as needed. 03/09/19   Emi Holes, PA-C    Family History No family history on file.  Social History Social History   Tobacco Use   Smoking status: Never Smoker   Smokeless tobacco: Never Used  Substance Use Topics   Alcohol use: Not on file   Drug use: Not on file     Allergies   Erythromycin   Review of Systems Review of Systems  Constitutional: Positive for appetite change and chills. Negative for fever.  HENT: Negative for facial swelling and sore throat.   Respiratory: Negative for shortness of  breath.   Cardiovascular: Negative for chest pain.  Gastrointestinal: Positive for abdominal pain and nausea. Negative for constipation, diarrhea and vomiting.  Genitourinary: Negative for dysuria.  Musculoskeletal: Negative for back pain.  Skin: Negative for rash and wound.  Neurological: Negative for headaches.  Psychiatric/Behavioral: The patient is not nervous/anxious.      Physical Exam Updated Vital Signs BP 111/76    Pulse 80    Temp 98.7 F (37.1 C) (Oral)    Resp 16    Ht 5\' 5"  (1.651 m)    Wt 41.7 kg    LMP 02/19/2019 (Approximate)    SpO2 98%    BMI 15.31 kg/m   Physical Exam Vitals signs and nursing note reviewed.  Constitutional:      General: She is not in acute distress.    Appearance: She is well-developed. She is not diaphoretic.  HENT:     Head: Normocephalic and atraumatic.     Mouth/Throat:     Pharynx: No oropharyngeal exudate.  Eyes:     General: No scleral icterus.       Right eye: No discharge.        Left eye: No discharge.     Conjunctiva/sclera: Conjunctivae normal.     Pupils: Pupils are equal, round, and reactive to light.  Neck:     Musculoskeletal: Normal range of motion and neck supple.  Thyroid: No thyromegaly.  Cardiovascular:     Rate and Rhythm: Normal rate and regular rhythm.     Heart sounds: Normal heart sounds. No murmur. No friction rub. No gallop.   Pulmonary:     Effort: Pulmonary effort is normal. No respiratory distress.     Breath sounds: Normal breath sounds. No stridor. No wheezing or rales.  Abdominal:     General: Bowel sounds are normal. There is no distension.     Palpations: Abdomen is soft.     Tenderness: There is abdominal tenderness in the right lower quadrant. There is no right CVA tenderness, left CVA tenderness, guarding or rebound. Positive signs include McBurney's sign and psoas sign.  Lymphadenopathy:     Cervical: No cervical adenopathy.  Skin:    General: Skin is warm and dry.     Coloration: Skin is  not pale.     Findings: No rash.  Neurological:     Mental Status: She is alert.     Coordination: Coordination normal.      ED Treatments / Results  Labs (all labs ordered are listed, but only abnormal results are displayed) Labs Reviewed  CBC WITH DIFFERENTIAL/PLATELET - Abnormal; Notable for the following components:      Result Value   Hemoglobin 11.5 (*)    All other components within normal limits  HEPATIC FUNCTION PANEL - Abnormal; Notable for the following components:   AST 13 (*)    All other components within normal limits  URINALYSIS, ROUTINE W REFLEX MICROSCOPIC - Abnormal; Notable for the following components:   APPearance CLOUDY (*)    Ketones, ur 20 (*)    Leukocytes,Ua LARGE (*)    Bacteria, UA RARE (*)    All other components within normal limits  I-STAT CHEM 8, ED - Abnormal; Notable for the following components:   Potassium 3.4 (*)    All other components within normal limits  URINE CULTURE  I-STAT BETA HCG BLOOD, ED ([facility], [facility], [facility] ONLY)    EKG None  Radiology US Pelvis Complete  Result Date: 03/09/2019 CLINICAL DATA:  Right lower quadrant pain. Right ovarian cyst seen on CT. EXAM: TRANSABDOMINAL ULTRASOUND OF PELVIS DOPPLER ULTRASOUND OF OVARIES TECHNIQUE: Transabdominal ultrasound examination of the pelvis was performed including evaluation of the uterus, ovaries, adnexal regions, and pelvic cul-de-sac. Color and duplex Doppler ultrasound was utilized to evaluate blood flow to the ovaries. COMPARISON:  CT abdomen pelvis from same day. FINDINGS: Uterus Measurements: 8.4 x 2.2 x 4.5 cm = volume: 83 mL. No fibroids or other mass visualized. Endometrium Thickness: 5 mm.  No focal abnormality visualized. Right ovary Measurements: 5.1 x 4.9 x 7.5 cm = volume: 98 mL. There is a 4.2 x 3.7 x 5.2 cm complex cyst with reticular pattern of internal echoes, consistent with a hemorrhagic cyst. Left ovary Not visualized. Pulsed Doppler evaluation demonstrates normal  low-resistance arterial and venous waveforms in the right ovary. Other: None. IMPRESSION: 1. No evidence of right ovarian torsion. 2. 5.2 cm hemorrhagic cyst in the right ovary. Follow-up pelvic ultrasound in 6-12 weeks is recommended to ensure resolution. This recommendation follows the consensus statement: Management of Asymptomatic Ovarian and Other Adnexal Cysts Imaged at [place]: Society of Radiologists in [place]. Radiology 2010; [phone]. 3. Nonvisualization of the left ovary. Electronically Signed   By: Titus Dubin M.D.   On: 03/09/2019 11:35   Ct Abdomen Pelvis W Contrast  Result Date: 03/09/2019 CLINICAL DATA:  Right lower quadrant pain with  nausea and vomiting EXAM: CT ABDOMEN AND PELVIS WITH CONTRAST TECHNIQUE: Multidetector CT imaging of the abdomen and pelvis was performed using the standard protocol following bolus administration of intravenous contrast. CONTRAST:  80mL OMNIPAQUE IOHEXOL 300 MG/ML  SOLN COMPARISON:  None. FINDINGS: Lower chest: Lung bases are clear. Hepatobiliary: No focal liver lesions are evident. The gallbladder wall is not appreciably thickened. There is no biliary duct dilatation. Pancreas: There is no pancreatic mass or inflammatory focus. Spleen: No splenic lesions are evident. Adrenals/Urinary Tract: Adrenals bilaterally appear unremarkable. Kidneys bilaterally show no evident mass or hydronephrosis on either side. There is no evident renal or ureteral calculus on either side. Urinary bladder is midline with wall thickness within normal limits. Stomach/Bowel: There is no appreciable bowel wall or mesenteric thickening. There is no appreciable bowel obstruction. The terminal ileum appears unremarkable. There is no free air or portal venous air. Vascular/Lymphatic: There is no abdominal aortic aneurysm. No vascular lesions are evident. Note that there are mildly prominent venous structures tracking between the spleen in lateral left  kidney. No adenopathy is evident in the abdomen or pelvis. Reproductive: Uterus is in the midline. There is a cystic mass in the right pelvis posteriorly which displaces the rectum slightly toward the left. This cystic mass measures 5.3 x 5.0 cm. It has a mildly thickened wall with surrounding fluid. A smaller cystic mass with mild surrounding fluid is noted in the left adnexa measuring 2.8 x 2.5 cm. Other: The appendix appears within normal limits. There is no abscess in the abdomen or pelvis. Ascites tracks into the anterior pelvis and partially surrounds the bladder. Minimal ascites is seen adjacent to the gallbladder in the right abdomen. Musculoskeletal: No blastic or lytic bone lesions. There is no intramuscular or abdominal wall lesion. IMPRESSION: 1. Cystic mass in the posterior right pelvis, likely arising from the right ovary measuring 5.3 x 5.0 cm. This cystic mass is a slightly thickened wall and mild surrounding fluid. Question recent ovarian cyst rupture. Early ovarian torsion could present in this manner; pelvic ultrasound with Doppler assessment advised in this regard. 2. Smaller left ovarian cyst, may represent a dominant follicle. Minimal surrounding fluid is seen in this area as well. There may have been recent left ovarian cyst rupture. 3. Ascites tracks into the anterior pelvis and partially surrounds the bladder. Question fluid from the suspected ovarian cyst leakage/rupture or posteriorly. Minimal ascites is seen adjacent to the gallbladder in the right upper abdomen. 4. Appendix appears unremarkable. No bowel obstruction. No abscess in the abdomen or pelvis. 5. Mildly prominent venous structures between the left kidney and spleen. No similar findings elsewhere. Etiology for this venous prominence is uncertain. Spleen is normal in size and contour. Electronically Signed   By: Bretta BangWilliam  Woodruff III M.D.   On: 03/09/2019 10:08   [place] Art/ven Flow Abd Pelv Doppler  Result Date:  03/09/2019 CLINICAL DATA:  Right lower quadrant pain. Right ovarian cyst seen on CT. EXAM: TRANSABDOMINAL ULTRASOUND OF PELVIS DOPPLER ULTRASOUND OF OVARIES TECHNIQUE: Transabdominal ultrasound examination of the pelvis was performed including evaluation of the uterus, ovaries, adnexal regions, and pelvic cul-de-sac. Color and duplex Doppler ultrasound was utilized to evaluate blood flow to the ovaries. COMPARISON:  CT abdomen pelvis from same day. FINDINGS: Uterus Measurements: 8.4 x 2.2 x 4.5 cm = volume: 83 mL. No fibroids or other mass visualized. Endometrium Thickness: 5 mm.  No focal abnormality visualized. Right ovary Measurements: 5.1 x 4.9 x 7.5 cm = volume: 98 mL. There  is a 4.2 x 3.7 x 5.2 cm complex cyst with reticular pattern of internal echoes, consistent with a hemorrhagic cyst. Left ovary Not visualized. Pulsed Doppler evaluation demonstrates normal low-resistance arterial and venous waveforms in the right ovary. Other: None. IMPRESSION: 1. No evidence of right ovarian torsion. 2. 5.2 cm hemorrhagic cyst in the right ovary. Follow-up pelvic ultrasound in 6-12 weeks is recommended to ensure resolution. This recommendation follows the consensus statement: Management of Asymptomatic Ovarian and Other Adnexal Cysts Imaged at [place]: Society of Radiologists in Ultrasound Consensus Conference Statement. Radiology 2010; [phone]. 3. Nonvisualization of the left ovary. Electronically Signed   By: Obie Dredge M.D.   On: 03/09/2019 11:35    Procedures Procedures (including critical care time)  Medications Ordered in ED Medications  sodium chloride (PF) 0.9 % injection (has no administration in time range)  fentaNYL (SUBLIMAZE) injection 50 mcg (50 mcg Intravenous Given 03/09/19 0829)  iohexol (OMNIPAQUE) 300 MG/ML solution 100 mL (80 mLs Intravenous Contrast Given 03/09/19 0925)     Initial Impression / Assessment and Plan / ED Course  I have reviewed the triage vital signs and the nursing  notes.  Pertinent labs & imaging results that were available during my care of the patient were reviewed by me and considered in my medical decision making (see chart for details).        Patient presenting with 5 cm hemorrhagic cyst in the right ovary seen on CT.  Radiologist recommended pelvic ultrasound to rule out ovarian torsion.  Pelvic ultrasound shows no evidence ovarian torsion.  Repeat ultrasound recommended in 6 to 12 weeks, which was relayed to the patient.  UA shows large leukocytes, 21-50 WBCs.  I recommend that we treat this considering patient's vague urinary sensations or lack thereof, however patient is concerned about taking antibiotics unnecessarily.  I advised we can send culture and treat if positive.  She is agreeable to this.  Advised regular ibuprofen and given short course of Norco for breakthrough pain.  Close follow-up with PCP and OB/GYN.  Patient states she believes her OB/GYN is at her PCP office.  Return precautions discussed.  Patient understands and agrees with plan.  Patient vital stable throughout ED course and discharged in satisfactory condition.  Final Clinical Impressions(s) / ED Diagnoses   Final diagnoses:  Cyst of right ovary    ED Discharge Orders         Ordered    ibuprofen (ADVIL) 600 MG tablet  Every 6 hours PRN     03/09/19 1215    HYDROcodone-acetaminophen (NORCO/VICODIN) 5-325 MG tablet  Every 6 hours PRN     03/09/19 1215           LawWaylan Boga, PA-C 03/09/19 1522    Virgina Norfolk, DO 03/09/19 1525

## 2019-03-09 NOTE — ED Notes (Signed)
Patient is aware that urine sample is needed. 

## 2019-03-10 DIAGNOSIS — R110 Nausea: Secondary | ICD-10-CM | POA: Diagnosis not present

## 2019-03-10 LAB — URINE CULTURE: Culture: <10000 — AB

## 2019-03-11 DIAGNOSIS — F840 Autistic disorder: Secondary | ICD-10-CM | POA: Diagnosis not present

## 2019-03-11 DIAGNOSIS — N83201 Unspecified ovarian cyst, right side: Secondary | ICD-10-CM | POA: Diagnosis not present

## 2019-03-14 DIAGNOSIS — F411 Generalized anxiety disorder: Secondary | ICD-10-CM | POA: Diagnosis not present

## 2019-03-21 DIAGNOSIS — F411 Generalized anxiety disorder: Secondary | ICD-10-CM | POA: Diagnosis not present

## 2019-03-28 DIAGNOSIS — F411 Generalized anxiety disorder: Secondary | ICD-10-CM | POA: Diagnosis not present

## 2019-04-05 ENCOUNTER — Other Ambulatory Visit: Payer: Self-pay | Admitting: Obstetrics and Gynecology

## 2019-04-10 DIAGNOSIS — F411 Generalized anxiety disorder: Secondary | ICD-10-CM | POA: Diagnosis not present

## 2019-04-18 DIAGNOSIS — F411 Generalized anxiety disorder: Secondary | ICD-10-CM | POA: Diagnosis not present

## 2019-04-23 DIAGNOSIS — Z681 Body mass index (BMI) 19 or less, adult: Secondary | ICD-10-CM | POA: Diagnosis not present

## 2019-04-23 DIAGNOSIS — N83201 Unspecified ovarian cyst, right side: Secondary | ICD-10-CM | POA: Diagnosis not present

## 2019-04-25 DIAGNOSIS — F411 Generalized anxiety disorder: Secondary | ICD-10-CM | POA: Diagnosis not present

## 2019-05-02 DIAGNOSIS — F411 Generalized anxiety disorder: Secondary | ICD-10-CM | POA: Diagnosis not present

## 2019-05-16 DIAGNOSIS — F411 Generalized anxiety disorder: Secondary | ICD-10-CM | POA: Diagnosis not present

## 2019-05-23 DIAGNOSIS — F411 Generalized anxiety disorder: Secondary | ICD-10-CM | POA: Diagnosis not present

## 2019-05-30 DIAGNOSIS — F411 Generalized anxiety disorder: Secondary | ICD-10-CM | POA: Diagnosis not present

## 2019-06-06 DIAGNOSIS — F411 Generalized anxiety disorder: Secondary | ICD-10-CM | POA: Diagnosis not present

## 2019-06-13 DIAGNOSIS — F411 Generalized anxiety disorder: Secondary | ICD-10-CM | POA: Diagnosis not present

## 2019-06-19 DIAGNOSIS — R1031 Right lower quadrant pain: Secondary | ICD-10-CM | POA: Diagnosis not present

## 2019-06-19 DIAGNOSIS — N83201 Unspecified ovarian cyst, right side: Secondary | ICD-10-CM | POA: Diagnosis not present

## 2019-06-27 DIAGNOSIS — F411 Generalized anxiety disorder: Secondary | ICD-10-CM | POA: Diagnosis not present

## 2019-07-04 DIAGNOSIS — F411 Generalized anxiety disorder: Secondary | ICD-10-CM | POA: Diagnosis not present

## 2019-07-11 DIAGNOSIS — F411 Generalized anxiety disorder: Secondary | ICD-10-CM | POA: Diagnosis not present

## 2019-07-16 DIAGNOSIS — R636 Underweight: Secondary | ICD-10-CM | POA: Diagnosis not present

## 2019-07-16 DIAGNOSIS — F840 Autistic disorder: Secondary | ICD-10-CM | POA: Diagnosis not present

## 2019-07-16 DIAGNOSIS — N83201 Unspecified ovarian cyst, right side: Secondary | ICD-10-CM | POA: Diagnosis not present

## 2019-07-16 DIAGNOSIS — R5383 Other fatigue: Secondary | ICD-10-CM | POA: Diagnosis not present

## 2019-07-18 DIAGNOSIS — F411 Generalized anxiety disorder: Secondary | ICD-10-CM | POA: Diagnosis not present

## 2019-07-25 DIAGNOSIS — F411 Generalized anxiety disorder: Secondary | ICD-10-CM | POA: Diagnosis not present

## 2019-07-29 DIAGNOSIS — R5383 Other fatigue: Secondary | ICD-10-CM | POA: Diagnosis not present

## 2019-07-30 DIAGNOSIS — E7210 Disorders of sulfur-bearing amino-acid metabolism, unspecified: Secondary | ICD-10-CM | POA: Diagnosis not present

## 2019-07-30 DIAGNOSIS — E559 Vitamin D deficiency, unspecified: Secondary | ICD-10-CM | POA: Diagnosis not present

## 2019-07-30 DIAGNOSIS — N83201 Unspecified ovarian cyst, right side: Secondary | ICD-10-CM | POA: Diagnosis not present

## 2019-07-30 DIAGNOSIS — R5383 Other fatigue: Secondary | ICD-10-CM | POA: Diagnosis not present

## 2019-08-01 DIAGNOSIS — F411 Generalized anxiety disorder: Secondary | ICD-10-CM | POA: Diagnosis not present

## 2019-08-08 DIAGNOSIS — F411 Generalized anxiety disorder: Secondary | ICD-10-CM | POA: Diagnosis not present

## 2019-08-15 DIAGNOSIS — F411 Generalized anxiety disorder: Secondary | ICD-10-CM | POA: Diagnosis not present

## 2019-08-22 DIAGNOSIS — F411 Generalized anxiety disorder: Secondary | ICD-10-CM | POA: Diagnosis not present

## 2019-08-27 DIAGNOSIS — R636 Underweight: Secondary | ICD-10-CM | POA: Diagnosis not present

## 2019-08-27 DIAGNOSIS — Z0001 Encounter for general adult medical examination with abnormal findings: Secondary | ICD-10-CM | POA: Diagnosis not present

## 2019-08-29 DIAGNOSIS — F411 Generalized anxiety disorder: Secondary | ICD-10-CM | POA: Diagnosis not present

## 2019-09-05 DIAGNOSIS — F411 Generalized anxiety disorder: Secondary | ICD-10-CM | POA: Diagnosis not present

## 2019-09-12 DIAGNOSIS — F411 Generalized anxiety disorder: Secondary | ICD-10-CM | POA: Diagnosis not present

## 2019-09-19 DIAGNOSIS — F411 Generalized anxiety disorder: Secondary | ICD-10-CM | POA: Diagnosis not present

## 2019-09-26 DIAGNOSIS — F411 Generalized anxiety disorder: Secondary | ICD-10-CM | POA: Diagnosis not present

## 2019-10-01 DIAGNOSIS — F411 Generalized anxiety disorder: Secondary | ICD-10-CM | POA: Diagnosis not present

## 2019-10-03 DIAGNOSIS — F411 Generalized anxiety disorder: Secondary | ICD-10-CM | POA: Diagnosis not present

## 2019-10-10 DIAGNOSIS — N83201 Unspecified ovarian cyst, right side: Secondary | ICD-10-CM | POA: Diagnosis not present

## 2019-10-10 DIAGNOSIS — N83202 Unspecified ovarian cyst, left side: Secondary | ICD-10-CM | POA: Diagnosis not present

## 2019-10-24 DIAGNOSIS — F411 Generalized anxiety disorder: Secondary | ICD-10-CM | POA: Diagnosis not present

## 2019-10-29 DIAGNOSIS — Z681 Body mass index (BMI) 19 or less, adult: Secondary | ICD-10-CM | POA: Diagnosis not present

## 2019-10-29 DIAGNOSIS — R636 Underweight: Secondary | ICD-10-CM | POA: Diagnosis not present

## 2019-10-31 DIAGNOSIS — F411 Generalized anxiety disorder: Secondary | ICD-10-CM | POA: Diagnosis not present

## 2019-11-07 DIAGNOSIS — F411 Generalized anxiety disorder: Secondary | ICD-10-CM | POA: Diagnosis not present

## 2019-11-14 DIAGNOSIS — F411 Generalized anxiety disorder: Secondary | ICD-10-CM | POA: Diagnosis not present

## 2019-11-21 DIAGNOSIS — F411 Generalized anxiety disorder: Secondary | ICD-10-CM | POA: Diagnosis not present

## 2019-11-28 DIAGNOSIS — F411 Generalized anxiety disorder: Secondary | ICD-10-CM | POA: Diagnosis not present

## 2019-12-05 DIAGNOSIS — F411 Generalized anxiety disorder: Secondary | ICD-10-CM | POA: Diagnosis not present

## 2019-12-19 DIAGNOSIS — F411 Generalized anxiety disorder: Secondary | ICD-10-CM | POA: Diagnosis not present

## 2019-12-26 DIAGNOSIS — F411 Generalized anxiety disorder: Secondary | ICD-10-CM | POA: Diagnosis not present

## 2020-01-02 DIAGNOSIS — F411 Generalized anxiety disorder: Secondary | ICD-10-CM | POA: Diagnosis not present

## 2020-01-09 DIAGNOSIS — F411 Generalized anxiety disorder: Secondary | ICD-10-CM | POA: Diagnosis not present

## 2020-01-16 DIAGNOSIS — F411 Generalized anxiety disorder: Secondary | ICD-10-CM | POA: Diagnosis not present

## 2020-01-23 DIAGNOSIS — F411 Generalized anxiety disorder: Secondary | ICD-10-CM | POA: Diagnosis not present

## 2020-01-30 DIAGNOSIS — F411 Generalized anxiety disorder: Secondary | ICD-10-CM | POA: Diagnosis not present

## 2020-02-06 DIAGNOSIS — F411 Generalized anxiety disorder: Secondary | ICD-10-CM | POA: Diagnosis not present

## 2020-02-13 DIAGNOSIS — F411 Generalized anxiety disorder: Secondary | ICD-10-CM | POA: Diagnosis not present

## 2020-02-20 DIAGNOSIS — F411 Generalized anxiety disorder: Secondary | ICD-10-CM | POA: Diagnosis not present

## 2020-02-27 DIAGNOSIS — H35413 Lattice degeneration of retina, bilateral: Secondary | ICD-10-CM | POA: Diagnosis not present

## 2020-02-27 DIAGNOSIS — F411 Generalized anxiety disorder: Secondary | ICD-10-CM | POA: Diagnosis not present

## 2020-03-05 DIAGNOSIS — F411 Generalized anxiety disorder: Secondary | ICD-10-CM | POA: Diagnosis not present

## 2020-03-19 DIAGNOSIS — F411 Generalized anxiety disorder: Secondary | ICD-10-CM | POA: Diagnosis not present

## 2020-03-26 DIAGNOSIS — F411 Generalized anxiety disorder: Secondary | ICD-10-CM | POA: Diagnosis not present

## 2020-04-02 DIAGNOSIS — F411 Generalized anxiety disorder: Secondary | ICD-10-CM | POA: Diagnosis not present

## 2020-04-23 DIAGNOSIS — F411 Generalized anxiety disorder: Secondary | ICD-10-CM | POA: Diagnosis not present

## 2020-04-30 DIAGNOSIS — F411 Generalized anxiety disorder: Secondary | ICD-10-CM | POA: Diagnosis not present

## 2020-05-07 DIAGNOSIS — F411 Generalized anxiety disorder: Secondary | ICD-10-CM | POA: Diagnosis not present

## 2020-05-14 DIAGNOSIS — F411 Generalized anxiety disorder: Secondary | ICD-10-CM | POA: Diagnosis not present

## 2020-05-21 DIAGNOSIS — F411 Generalized anxiety disorder: Secondary | ICD-10-CM | POA: Diagnosis not present

## 2020-05-28 DIAGNOSIS — F411 Generalized anxiety disorder: Secondary | ICD-10-CM | POA: Diagnosis not present

## 2020-06-04 DIAGNOSIS — F411 Generalized anxiety disorder: Secondary | ICD-10-CM | POA: Diagnosis not present

## 2020-06-04 DIAGNOSIS — F840 Autistic disorder: Secondary | ICD-10-CM | POA: Diagnosis not present

## 2020-06-11 DIAGNOSIS — F840 Autistic disorder: Secondary | ICD-10-CM | POA: Diagnosis not present

## 2020-06-11 DIAGNOSIS — F411 Generalized anxiety disorder: Secondary | ICD-10-CM | POA: Diagnosis not present

## 2020-06-18 DIAGNOSIS — F840 Autistic disorder: Secondary | ICD-10-CM | POA: Diagnosis not present

## 2020-06-18 DIAGNOSIS — F411 Generalized anxiety disorder: Secondary | ICD-10-CM | POA: Diagnosis not present

## 2020-06-25 DIAGNOSIS — F840 Autistic disorder: Secondary | ICD-10-CM | POA: Diagnosis not present

## 2020-06-25 DIAGNOSIS — F411 Generalized anxiety disorder: Secondary | ICD-10-CM | POA: Diagnosis not present

## 2020-07-02 DIAGNOSIS — F411 Generalized anxiety disorder: Secondary | ICD-10-CM | POA: Diagnosis not present

## 2020-07-02 DIAGNOSIS — F840 Autistic disorder: Secondary | ICD-10-CM | POA: Diagnosis not present

## 2020-07-12 DIAGNOSIS — F840 Autistic disorder: Secondary | ICD-10-CM | POA: Diagnosis not present

## 2020-07-12 DIAGNOSIS — F411 Generalized anxiety disorder: Secondary | ICD-10-CM | POA: Diagnosis not present

## 2020-07-16 DIAGNOSIS — F840 Autistic disorder: Secondary | ICD-10-CM | POA: Diagnosis not present

## 2020-07-16 DIAGNOSIS — F411 Generalized anxiety disorder: Secondary | ICD-10-CM | POA: Diagnosis not present

## 2020-07-23 DIAGNOSIS — F411 Generalized anxiety disorder: Secondary | ICD-10-CM | POA: Diagnosis not present

## 2020-07-23 DIAGNOSIS — F840 Autistic disorder: Secondary | ICD-10-CM | POA: Diagnosis not present

## 2020-08-06 DIAGNOSIS — F840 Autistic disorder: Secondary | ICD-10-CM | POA: Diagnosis not present

## 2020-08-06 DIAGNOSIS — F411 Generalized anxiety disorder: Secondary | ICD-10-CM | POA: Diagnosis not present

## 2020-08-13 DIAGNOSIS — F840 Autistic disorder: Secondary | ICD-10-CM | POA: Diagnosis not present

## 2020-08-13 DIAGNOSIS — F411 Generalized anxiety disorder: Secondary | ICD-10-CM | POA: Diagnosis not present

## 2020-08-20 DIAGNOSIS — F840 Autistic disorder: Secondary | ICD-10-CM | POA: Diagnosis not present

## 2020-08-20 DIAGNOSIS — F411 Generalized anxiety disorder: Secondary | ICD-10-CM | POA: Diagnosis not present

## 2020-08-27 DIAGNOSIS — F840 Autistic disorder: Secondary | ICD-10-CM | POA: Diagnosis not present

## 2020-08-27 DIAGNOSIS — F411 Generalized anxiety disorder: Secondary | ICD-10-CM | POA: Diagnosis not present

## 2020-09-03 DIAGNOSIS — F840 Autistic disorder: Secondary | ICD-10-CM | POA: Diagnosis not present

## 2020-09-03 DIAGNOSIS — F411 Generalized anxiety disorder: Secondary | ICD-10-CM | POA: Diagnosis not present

## 2020-09-17 DIAGNOSIS — F411 Generalized anxiety disorder: Secondary | ICD-10-CM | POA: Diagnosis not present

## 2020-09-17 DIAGNOSIS — F840 Autistic disorder: Secondary | ICD-10-CM | POA: Diagnosis not present

## 2020-09-24 DIAGNOSIS — F840 Autistic disorder: Secondary | ICD-10-CM | POA: Diagnosis not present

## 2020-09-24 DIAGNOSIS — F411 Generalized anxiety disorder: Secondary | ICD-10-CM | POA: Diagnosis not present

## 2020-10-08 DIAGNOSIS — F840 Autistic disorder: Secondary | ICD-10-CM | POA: Diagnosis not present

## 2020-10-08 DIAGNOSIS — F411 Generalized anxiety disorder: Secondary | ICD-10-CM | POA: Diagnosis not present

## 2020-10-15 DIAGNOSIS — F411 Generalized anxiety disorder: Secondary | ICD-10-CM | POA: Diagnosis not present

## 2020-10-15 DIAGNOSIS — F840 Autistic disorder: Secondary | ICD-10-CM | POA: Diagnosis not present

## 2020-10-22 DIAGNOSIS — F840 Autistic disorder: Secondary | ICD-10-CM | POA: Diagnosis not present

## 2020-10-22 DIAGNOSIS — F411 Generalized anxiety disorder: Secondary | ICD-10-CM | POA: Diagnosis not present

## 2020-10-29 DIAGNOSIS — F411 Generalized anxiety disorder: Secondary | ICD-10-CM | POA: Diagnosis not present

## 2020-10-29 DIAGNOSIS — F840 Autistic disorder: Secondary | ICD-10-CM | POA: Diagnosis not present

## 2020-11-05 DIAGNOSIS — F840 Autistic disorder: Secondary | ICD-10-CM | POA: Diagnosis not present

## 2020-11-05 DIAGNOSIS — F411 Generalized anxiety disorder: Secondary | ICD-10-CM | POA: Diagnosis not present

## 2020-11-10 DIAGNOSIS — F4325 Adjustment disorder with mixed disturbance of emotions and conduct: Secondary | ICD-10-CM | POA: Diagnosis not present

## 2020-11-12 DIAGNOSIS — F840 Autistic disorder: Secondary | ICD-10-CM | POA: Diagnosis not present

## 2020-11-12 DIAGNOSIS — F411 Generalized anxiety disorder: Secondary | ICD-10-CM | POA: Diagnosis not present

## 2020-11-19 DIAGNOSIS — F840 Autistic disorder: Secondary | ICD-10-CM | POA: Diagnosis not present

## 2020-11-19 DIAGNOSIS — F411 Generalized anxiety disorder: Secondary | ICD-10-CM | POA: Diagnosis not present

## 2020-11-24 DIAGNOSIS — F4322 Adjustment disorder with anxiety: Secondary | ICD-10-CM | POA: Diagnosis not present

## 2020-11-26 DIAGNOSIS — F411 Generalized anxiety disorder: Secondary | ICD-10-CM | POA: Diagnosis not present

## 2020-11-26 DIAGNOSIS — F840 Autistic disorder: Secondary | ICD-10-CM | POA: Diagnosis not present

## 2020-12-01 DIAGNOSIS — F4322 Adjustment disorder with anxiety: Secondary | ICD-10-CM | POA: Diagnosis not present

## 2020-12-03 DIAGNOSIS — F840 Autistic disorder: Secondary | ICD-10-CM | POA: Diagnosis not present

## 2020-12-03 DIAGNOSIS — F411 Generalized anxiety disorder: Secondary | ICD-10-CM | POA: Diagnosis not present

## 2020-12-10 DIAGNOSIS — F840 Autistic disorder: Secondary | ICD-10-CM | POA: Diagnosis not present

## 2020-12-10 DIAGNOSIS — F411 Generalized anxiety disorder: Secondary | ICD-10-CM | POA: Diagnosis not present

## 2020-12-15 DIAGNOSIS — F4322 Adjustment disorder with anxiety: Secondary | ICD-10-CM | POA: Diagnosis not present

## 2020-12-17 DIAGNOSIS — F411 Generalized anxiety disorder: Secondary | ICD-10-CM | POA: Diagnosis not present

## 2020-12-17 DIAGNOSIS — F840 Autistic disorder: Secondary | ICD-10-CM | POA: Diagnosis not present

## 2020-12-22 DIAGNOSIS — F4322 Adjustment disorder with anxiety: Secondary | ICD-10-CM | POA: Diagnosis not present

## 2020-12-24 DIAGNOSIS — F840 Autistic disorder: Secondary | ICD-10-CM | POA: Diagnosis not present

## 2020-12-24 DIAGNOSIS — F411 Generalized anxiety disorder: Secondary | ICD-10-CM | POA: Diagnosis not present

## 2020-12-29 DIAGNOSIS — F4322 Adjustment disorder with anxiety: Secondary | ICD-10-CM | POA: Diagnosis not present

## 2020-12-31 DIAGNOSIS — F411 Generalized anxiety disorder: Secondary | ICD-10-CM | POA: Diagnosis not present

## 2020-12-31 DIAGNOSIS — F840 Autistic disorder: Secondary | ICD-10-CM | POA: Diagnosis not present

## 2021-01-05 DIAGNOSIS — F4322 Adjustment disorder with anxiety: Secondary | ICD-10-CM | POA: Diagnosis not present

## 2021-01-07 DIAGNOSIS — F411 Generalized anxiety disorder: Secondary | ICD-10-CM | POA: Diagnosis not present

## 2021-01-07 DIAGNOSIS — F840 Autistic disorder: Secondary | ICD-10-CM | POA: Diagnosis not present

## 2021-01-12 DIAGNOSIS — F4322 Adjustment disorder with anxiety: Secondary | ICD-10-CM | POA: Diagnosis not present

## 2021-01-14 DIAGNOSIS — F411 Generalized anxiety disorder: Secondary | ICD-10-CM | POA: Diagnosis not present

## 2021-01-14 DIAGNOSIS — F840 Autistic disorder: Secondary | ICD-10-CM | POA: Diagnosis not present

## 2021-01-19 DIAGNOSIS — F4322 Adjustment disorder with anxiety: Secondary | ICD-10-CM | POA: Diagnosis not present

## 2021-01-21 DIAGNOSIS — F840 Autistic disorder: Secondary | ICD-10-CM | POA: Diagnosis not present

## 2021-01-21 DIAGNOSIS — F411 Generalized anxiety disorder: Secondary | ICD-10-CM | POA: Diagnosis not present

## 2021-01-26 DIAGNOSIS — F4322 Adjustment disorder with anxiety: Secondary | ICD-10-CM | POA: Diagnosis not present

## 2021-01-28 DIAGNOSIS — F840 Autistic disorder: Secondary | ICD-10-CM | POA: Diagnosis not present

## 2021-01-28 DIAGNOSIS — F411 Generalized anxiety disorder: Secondary | ICD-10-CM | POA: Diagnosis not present

## 2021-02-01 DIAGNOSIS — F411 Generalized anxiety disorder: Secondary | ICD-10-CM | POA: Diagnosis not present

## 2021-02-01 DIAGNOSIS — F840 Autistic disorder: Secondary | ICD-10-CM | POA: Diagnosis not present

## 2021-02-02 DIAGNOSIS — F4322 Adjustment disorder with anxiety: Secondary | ICD-10-CM | POA: Diagnosis not present

## 2021-02-04 DIAGNOSIS — F411 Generalized anxiety disorder: Secondary | ICD-10-CM | POA: Diagnosis not present

## 2021-02-04 DIAGNOSIS — F840 Autistic disorder: Secondary | ICD-10-CM | POA: Diagnosis not present

## 2021-02-09 DIAGNOSIS — F4322 Adjustment disorder with anxiety: Secondary | ICD-10-CM | POA: Diagnosis not present

## 2021-02-11 DIAGNOSIS — F840 Autistic disorder: Secondary | ICD-10-CM | POA: Diagnosis not present

## 2021-02-11 DIAGNOSIS — F411 Generalized anxiety disorder: Secondary | ICD-10-CM | POA: Diagnosis not present

## 2021-02-16 DIAGNOSIS — F4322 Adjustment disorder with anxiety: Secondary | ICD-10-CM | POA: Diagnosis not present

## 2021-02-23 DIAGNOSIS — F4322 Adjustment disorder with anxiety: Secondary | ICD-10-CM | POA: Diagnosis not present

## 2021-02-25 DIAGNOSIS — F840 Autistic disorder: Secondary | ICD-10-CM | POA: Diagnosis not present

## 2021-02-25 DIAGNOSIS — F411 Generalized anxiety disorder: Secondary | ICD-10-CM | POA: Diagnosis not present

## 2021-03-02 DIAGNOSIS — F4322 Adjustment disorder with anxiety: Secondary | ICD-10-CM | POA: Diagnosis not present

## 2021-03-04 DIAGNOSIS — H35413 Lattice degeneration of retina, bilateral: Secondary | ICD-10-CM | POA: Diagnosis not present

## 2021-03-09 DIAGNOSIS — F4322 Adjustment disorder with anxiety: Secondary | ICD-10-CM | POA: Diagnosis not present

## 2021-03-11 DIAGNOSIS — F840 Autistic disorder: Secondary | ICD-10-CM | POA: Diagnosis not present

## 2021-03-11 DIAGNOSIS — F411 Generalized anxiety disorder: Secondary | ICD-10-CM | POA: Diagnosis not present

## 2021-03-16 DIAGNOSIS — F4322 Adjustment disorder with anxiety: Secondary | ICD-10-CM | POA: Diagnosis not present

## 2021-03-23 DIAGNOSIS — F4322 Adjustment disorder with anxiety: Secondary | ICD-10-CM | POA: Diagnosis not present

## 2021-03-25 DIAGNOSIS — F411 Generalized anxiety disorder: Secondary | ICD-10-CM | POA: Diagnosis not present

## 2021-03-25 DIAGNOSIS — F840 Autistic disorder: Secondary | ICD-10-CM | POA: Diagnosis not present

## 2021-03-30 DIAGNOSIS — F4322 Adjustment disorder with anxiety: Secondary | ICD-10-CM | POA: Diagnosis not present

## 2021-04-01 DIAGNOSIS — F840 Autistic disorder: Secondary | ICD-10-CM | POA: Diagnosis not present

## 2021-04-01 DIAGNOSIS — F411 Generalized anxiety disorder: Secondary | ICD-10-CM | POA: Diagnosis not present

## 2021-04-06 DIAGNOSIS — F4322 Adjustment disorder with anxiety: Secondary | ICD-10-CM | POA: Diagnosis not present

## 2021-04-15 DIAGNOSIS — F840 Autistic disorder: Secondary | ICD-10-CM | POA: Diagnosis not present

## 2021-04-15 DIAGNOSIS — F411 Generalized anxiety disorder: Secondary | ICD-10-CM | POA: Diagnosis not present

## 2021-04-20 DIAGNOSIS — F4322 Adjustment disorder with anxiety: Secondary | ICD-10-CM | POA: Diagnosis not present

## 2021-04-22 DIAGNOSIS — F840 Autistic disorder: Secondary | ICD-10-CM | POA: Diagnosis not present

## 2021-04-22 DIAGNOSIS — F411 Generalized anxiety disorder: Secondary | ICD-10-CM | POA: Diagnosis not present

## 2021-04-27 DIAGNOSIS — F4322 Adjustment disorder with anxiety: Secondary | ICD-10-CM | POA: Diagnosis not present

## 2021-05-04 DIAGNOSIS — F4322 Adjustment disorder with anxiety: Secondary | ICD-10-CM | POA: Diagnosis not present

## 2021-05-13 DIAGNOSIS — F411 Generalized anxiety disorder: Secondary | ICD-10-CM | POA: Diagnosis not present

## 2021-05-13 DIAGNOSIS — F840 Autistic disorder: Secondary | ICD-10-CM | POA: Diagnosis not present

## 2021-05-18 DIAGNOSIS — F4322 Adjustment disorder with anxiety: Secondary | ICD-10-CM | POA: Diagnosis not present

## 2021-05-20 DIAGNOSIS — F840 Autistic disorder: Secondary | ICD-10-CM | POA: Diagnosis not present

## 2021-05-20 DIAGNOSIS — F411 Generalized anxiety disorder: Secondary | ICD-10-CM | POA: Diagnosis not present

## 2021-05-25 DIAGNOSIS — F4322 Adjustment disorder with anxiety: Secondary | ICD-10-CM | POA: Diagnosis not present

## 2021-05-27 DIAGNOSIS — F411 Generalized anxiety disorder: Secondary | ICD-10-CM | POA: Diagnosis not present

## 2021-05-27 DIAGNOSIS — F840 Autistic disorder: Secondary | ICD-10-CM | POA: Diagnosis not present

## 2021-06-01 DIAGNOSIS — F4322 Adjustment disorder with anxiety: Secondary | ICD-10-CM | POA: Diagnosis not present

## 2021-06-03 DIAGNOSIS — F840 Autistic disorder: Secondary | ICD-10-CM | POA: Diagnosis not present

## 2021-06-03 DIAGNOSIS — F411 Generalized anxiety disorder: Secondary | ICD-10-CM | POA: Diagnosis not present

## 2021-06-10 DIAGNOSIS — F840 Autistic disorder: Secondary | ICD-10-CM | POA: Diagnosis not present

## 2021-06-10 DIAGNOSIS — F411 Generalized anxiety disorder: Secondary | ICD-10-CM | POA: Diagnosis not present

## 2021-06-15 DIAGNOSIS — F4322 Adjustment disorder with anxiety: Secondary | ICD-10-CM | POA: Diagnosis not present

## 2021-06-24 DIAGNOSIS — F411 Generalized anxiety disorder: Secondary | ICD-10-CM | POA: Diagnosis not present

## 2021-06-24 DIAGNOSIS — F840 Autistic disorder: Secondary | ICD-10-CM | POA: Diagnosis not present

## 2021-06-29 DIAGNOSIS — F4322 Adjustment disorder with anxiety: Secondary | ICD-10-CM | POA: Diagnosis not present

## 2021-07-01 DIAGNOSIS — F840 Autistic disorder: Secondary | ICD-10-CM | POA: Diagnosis not present

## 2021-07-01 DIAGNOSIS — F411 Generalized anxiety disorder: Secondary | ICD-10-CM | POA: Diagnosis not present

## 2021-07-06 DIAGNOSIS — F4322 Adjustment disorder with anxiety: Secondary | ICD-10-CM | POA: Diagnosis not present

## 2021-07-08 DIAGNOSIS — F840 Autistic disorder: Secondary | ICD-10-CM | POA: Diagnosis not present

## 2021-07-08 DIAGNOSIS — F411 Generalized anxiety disorder: Secondary | ICD-10-CM | POA: Diagnosis not present

## 2021-07-13 DIAGNOSIS — F4322 Adjustment disorder with anxiety: Secondary | ICD-10-CM | POA: Diagnosis not present

## 2021-07-15 DIAGNOSIS — F840 Autistic disorder: Secondary | ICD-10-CM | POA: Diagnosis not present

## 2021-07-15 DIAGNOSIS — F411 Generalized anxiety disorder: Secondary | ICD-10-CM | POA: Diagnosis not present

## 2021-07-20 DIAGNOSIS — F4322 Adjustment disorder with anxiety: Secondary | ICD-10-CM | POA: Diagnosis not present

## 2021-07-22 DIAGNOSIS — F840 Autistic disorder: Secondary | ICD-10-CM | POA: Diagnosis not present

## 2021-07-22 DIAGNOSIS — F411 Generalized anxiety disorder: Secondary | ICD-10-CM | POA: Diagnosis not present

## 2021-07-29 DIAGNOSIS — F411 Generalized anxiety disorder: Secondary | ICD-10-CM | POA: Diagnosis not present

## 2021-07-29 DIAGNOSIS — F840 Autistic disorder: Secondary | ICD-10-CM | POA: Diagnosis not present

## 2021-08-03 DIAGNOSIS — F4322 Adjustment disorder with anxiety: Secondary | ICD-10-CM | POA: Diagnosis not present

## 2021-08-05 DIAGNOSIS — F840 Autistic disorder: Secondary | ICD-10-CM | POA: Diagnosis not present

## 2021-08-05 DIAGNOSIS — F411 Generalized anxiety disorder: Secondary | ICD-10-CM | POA: Diagnosis not present

## 2021-08-10 DIAGNOSIS — F4322 Adjustment disorder with anxiety: Secondary | ICD-10-CM | POA: Diagnosis not present

## 2021-08-12 DIAGNOSIS — F840 Autistic disorder: Secondary | ICD-10-CM | POA: Diagnosis not present

## 2021-08-12 DIAGNOSIS — F411 Generalized anxiety disorder: Secondary | ICD-10-CM | POA: Diagnosis not present

## 2021-08-17 DIAGNOSIS — F4322 Adjustment disorder with anxiety: Secondary | ICD-10-CM | POA: Diagnosis not present

## 2021-08-19 DIAGNOSIS — F411 Generalized anxiety disorder: Secondary | ICD-10-CM | POA: Diagnosis not present

## 2021-08-19 DIAGNOSIS — F840 Autistic disorder: Secondary | ICD-10-CM | POA: Diagnosis not present

## 2021-08-24 DIAGNOSIS — F4322 Adjustment disorder with anxiety: Secondary | ICD-10-CM | POA: Diagnosis not present

## 2021-08-26 DIAGNOSIS — F840 Autistic disorder: Secondary | ICD-10-CM | POA: Diagnosis not present

## 2021-08-26 DIAGNOSIS — F411 Generalized anxiety disorder: Secondary | ICD-10-CM | POA: Diagnosis not present

## 2021-08-31 DIAGNOSIS — F4322 Adjustment disorder with anxiety: Secondary | ICD-10-CM | POA: Diagnosis not present

## 2021-09-07 DIAGNOSIS — F4322 Adjustment disorder with anxiety: Secondary | ICD-10-CM | POA: Diagnosis not present

## 2021-09-09 DIAGNOSIS — F840 Autistic disorder: Secondary | ICD-10-CM | POA: Diagnosis not present

## 2021-09-09 DIAGNOSIS — F411 Generalized anxiety disorder: Secondary | ICD-10-CM | POA: Diagnosis not present

## 2021-09-14 DIAGNOSIS — F4322 Adjustment disorder with anxiety: Secondary | ICD-10-CM | POA: Diagnosis not present

## 2021-09-16 DIAGNOSIS — F411 Generalized anxiety disorder: Secondary | ICD-10-CM | POA: Diagnosis not present

## 2021-09-16 DIAGNOSIS — F840 Autistic disorder: Secondary | ICD-10-CM | POA: Diagnosis not present

## 2021-09-21 DIAGNOSIS — F4322 Adjustment disorder with anxiety: Secondary | ICD-10-CM | POA: Diagnosis not present

## 2021-09-23 DIAGNOSIS — F411 Generalized anxiety disorder: Secondary | ICD-10-CM | POA: Diagnosis not present

## 2021-09-23 DIAGNOSIS — F840 Autistic disorder: Secondary | ICD-10-CM | POA: Diagnosis not present

## 2021-09-28 DIAGNOSIS — F4322 Adjustment disorder with anxiety: Secondary | ICD-10-CM | POA: Diagnosis not present

## 2021-10-05 DIAGNOSIS — F4322 Adjustment disorder with anxiety: Secondary | ICD-10-CM | POA: Diagnosis not present

## 2021-10-07 DIAGNOSIS — F840 Autistic disorder: Secondary | ICD-10-CM | POA: Diagnosis not present

## 2021-10-07 DIAGNOSIS — F411 Generalized anxiety disorder: Secondary | ICD-10-CM | POA: Diagnosis not present

## 2021-10-12 DIAGNOSIS — F4322 Adjustment disorder with anxiety: Secondary | ICD-10-CM | POA: Diagnosis not present

## 2021-10-14 DIAGNOSIS — F411 Generalized anxiety disorder: Secondary | ICD-10-CM | POA: Diagnosis not present

## 2021-10-14 DIAGNOSIS — F840 Autistic disorder: Secondary | ICD-10-CM | POA: Diagnosis not present

## 2021-10-19 DIAGNOSIS — F4322 Adjustment disorder with anxiety: Secondary | ICD-10-CM | POA: Diagnosis not present

## 2021-10-19 IMAGING — CT CT ABD-PELV W/ CM
2 of 4 series · 15 of 46 positions shown, 17 images · IV contrast (omnipaque)
Comparison: None.

CLINICAL DATA: Right lower quadrant pain with nausea and vomiting

EXAM:
CT ABDOMEN AND PELVIS WITH CONTRAST
TECHNIQUE: Multidetector CT imaging of the abdomen and pelvis was performed
using the standard protocol following bolus administration of
intravenous contrast.
CONTRAST:  80mL OMNIPAQUE IOHEXOL 300 MG/ML  SOLN

[Series 2: axial st · axial · 0.74mm/px · z∈[-431,-56]mm · 12 of 85 slices shown, 14 images]
[im 5/85  soft-tissue]
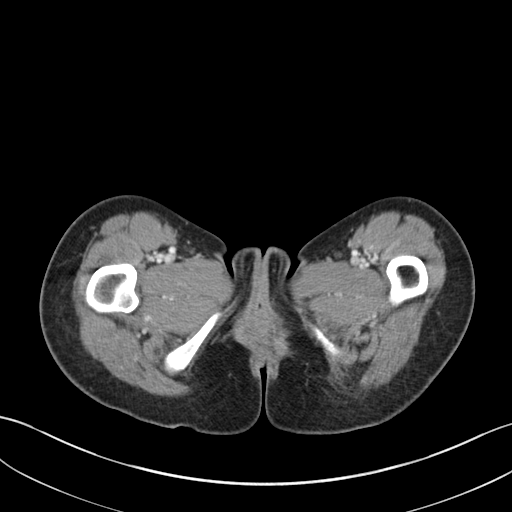
[im 5/85  bone]
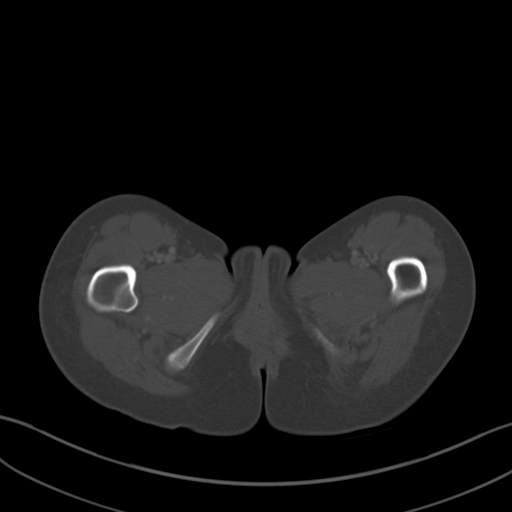
[im 15/85  soft-tissue]
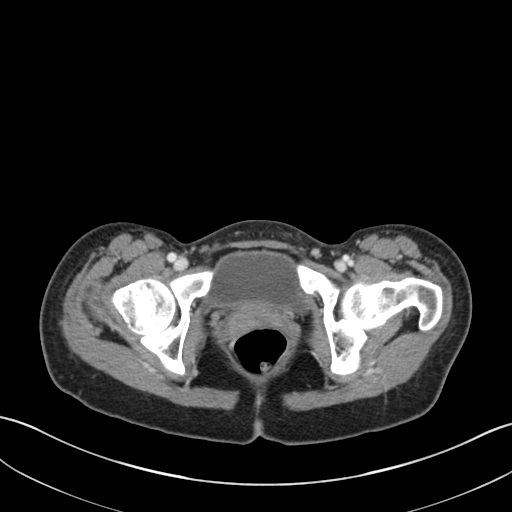
[im 20/85  soft-tissue]
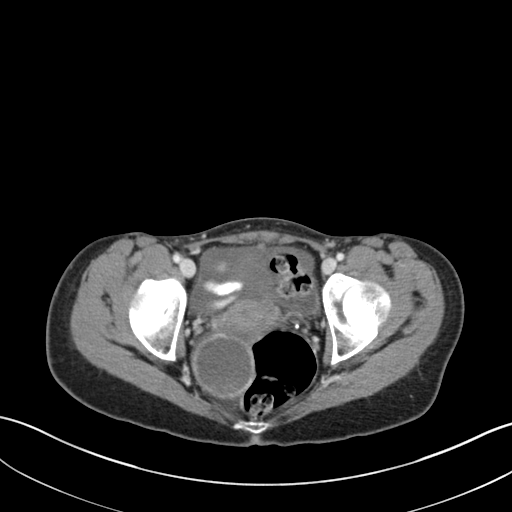
[im 25/85  soft-tissue]
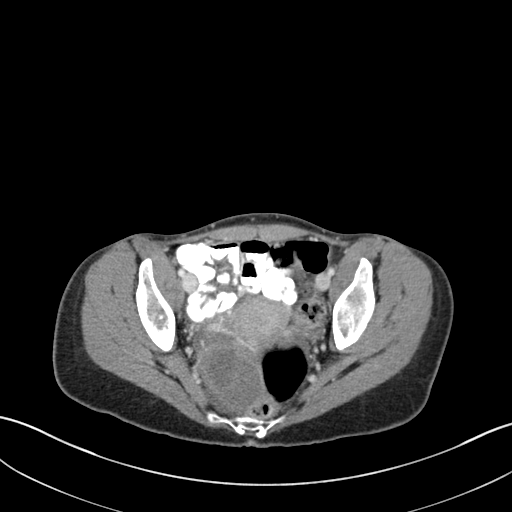
[im 35/85  soft-tissue]
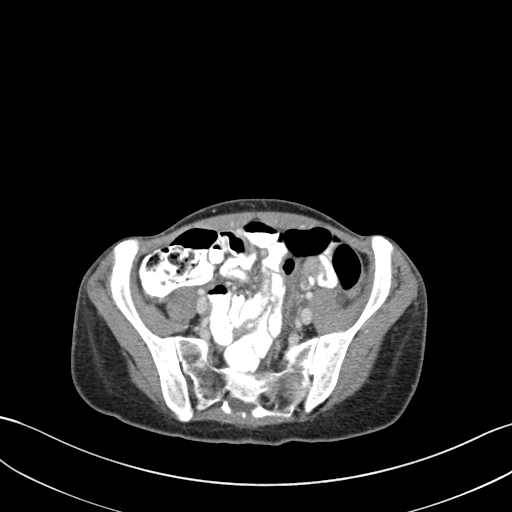
[im 40/85  soft-tissue]
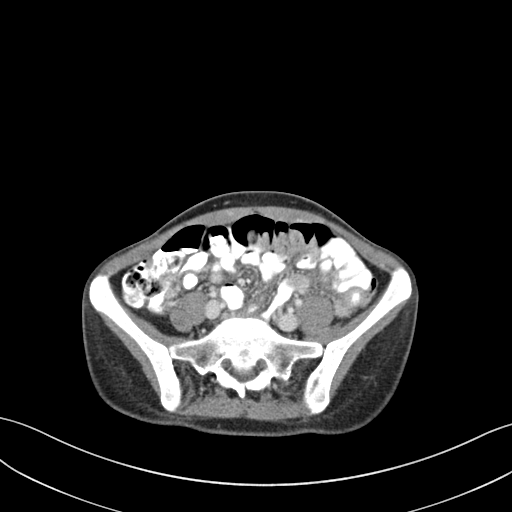
[im 45/85  soft-tissue]
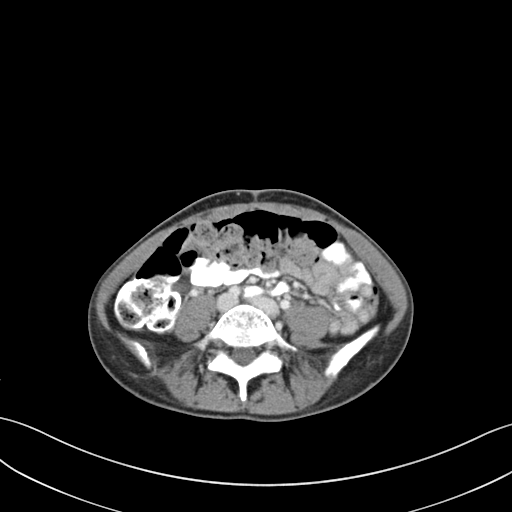
[im 55/85  soft-tissue]
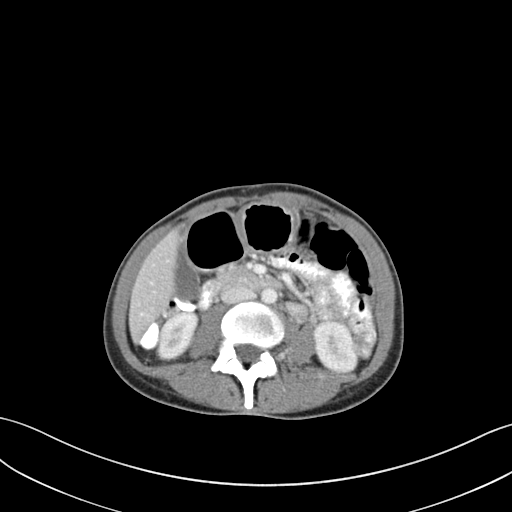
[im 60/85  soft-tissue]
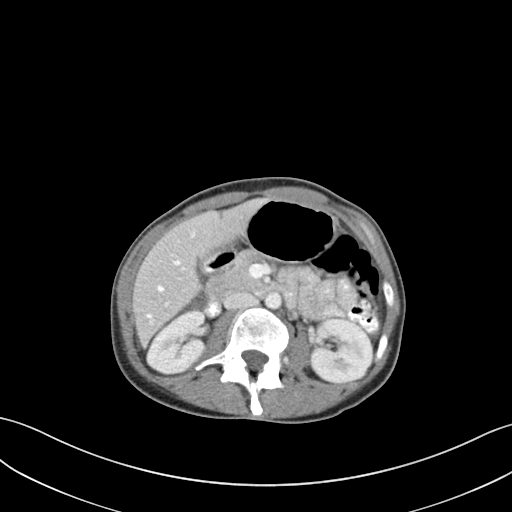
[im 60/85  bone]
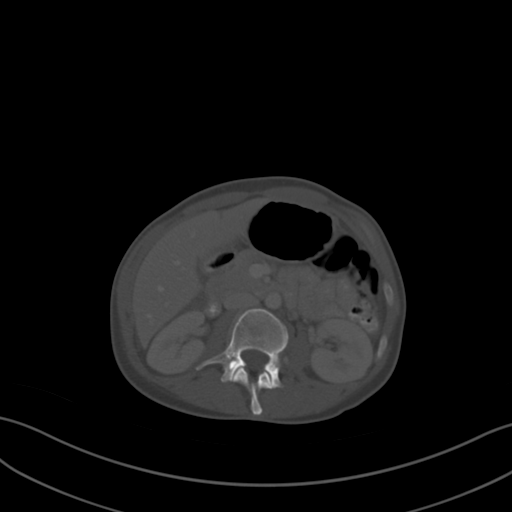
[im 65/85  soft-tissue]
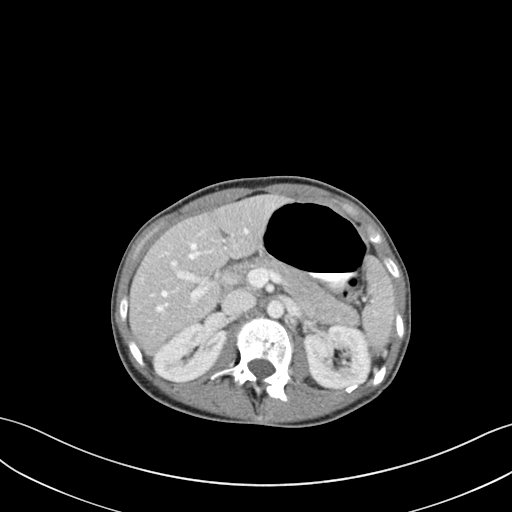
[im 75/85  soft-tissue]
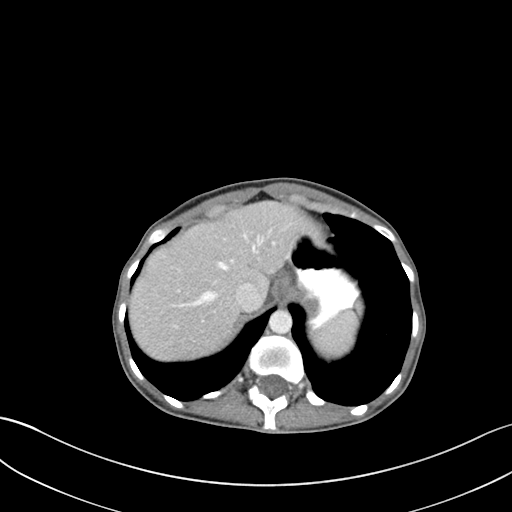
[im 80/85  soft-tissue]
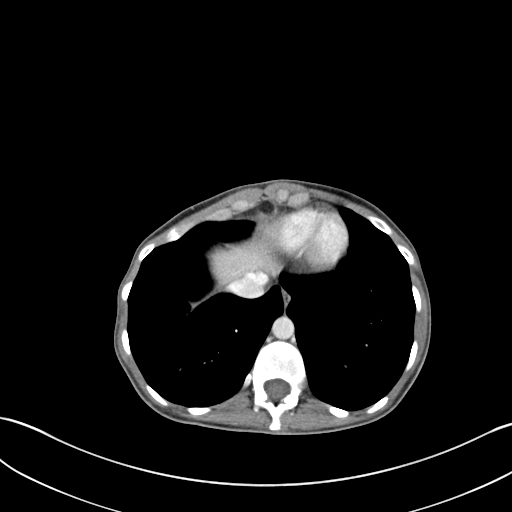

[Series 4: coronal st · coronal · 0.71mm/px · 3 of 98 slices shown]
[im 33/98  soft-tissue]
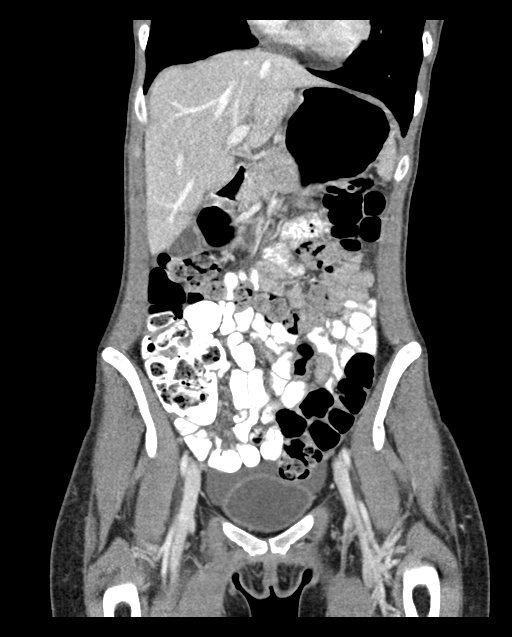
[im 44/98  soft-tissue]
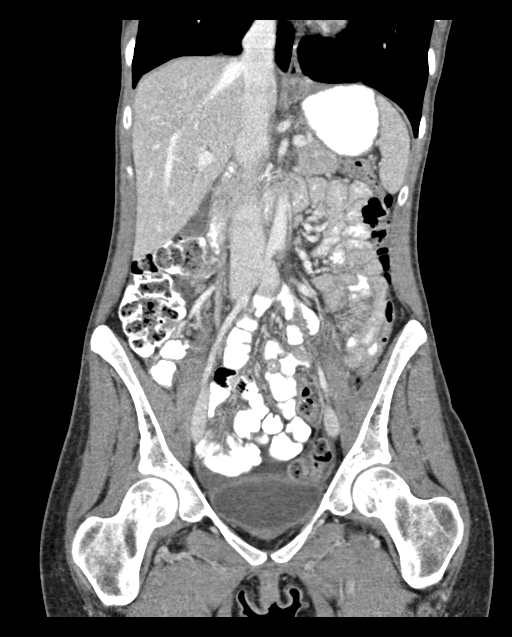
[im 54/98  soft-tissue]
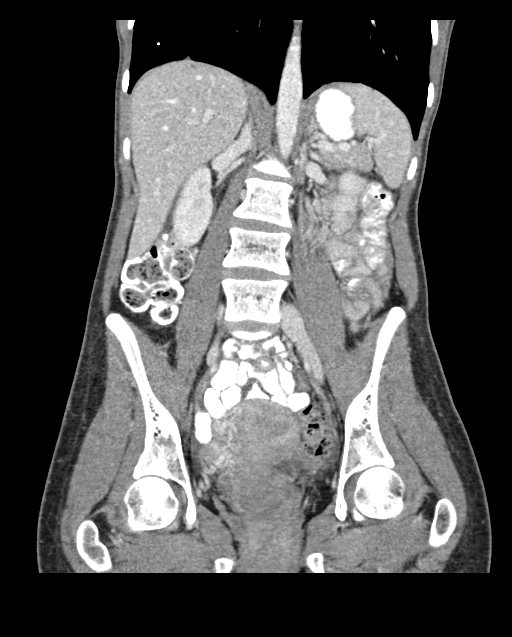

[15 of 46 positions shown; findings below may reference images not displayed]

FINDINGS: Lower chest: Lung bases are clear.

Hepatobiliary: No focal liver lesions are evident. The gallbladder
wall is not appreciably thickened. There is no biliary duct
dilatation.

Pancreas: There is no pancreatic mass or inflammatory focus.

Spleen: No splenic lesions are evident.

Adrenals/Urinary Tract: Adrenals bilaterally appear unremarkable.
Kidneys bilaterally show no evident mass or hydronephrosis on either
side. There is no evident renal or ureteral calculus on either side.
Urinary bladder is midline with wall thickness within normal limits.

Stomach/Bowel: There is no appreciable bowel wall or mesenteric
thickening. There is no appreciable bowel obstruction. The terminal
ileum appears unremarkable. There is no free air or portal venous
air.

Vascular/Lymphatic: There is no abdominal aortic aneurysm. No
vascular lesions are evident. Note that there are mildly prominent
venous structures tracking between the spleen in lateral left
kidney. No adenopathy is evident in the abdomen or pelvis.

Reproductive: Uterus is in the midline. There is a cystic mass in
the right pelvis posteriorly which displaces the rectum slightly
toward the left. This cystic mass measures 5.3 x 5.0 cm. It has a
mildly thickened wall with surrounding fluid. A smaller cystic mass
with mild surrounding fluid is noted in the left adnexa measuring
2.8 x 2.5 cm.

Other: The appendix appears within normal limits. There is no
abscess in the abdomen or pelvis. Ascites tracks into the anterior
pelvis and partially surrounds the bladder. Minimal ascites is seen
adjacent to the gallbladder in the right abdomen.

Musculoskeletal: No blastic or lytic bone lesions. There is no
intramuscular or abdominal wall lesion.
IMPRESSION: 1. Cystic mass in the posterior right pelvis, likely arising from
the right ovary measuring 5.3 x 5.0 cm. This cystic mass is a
slightly thickened wall and mild surrounding fluid. Question recent
ovarian cyst rupture. Early ovarian torsion could present in this
manner; pelvic ultrasound with Doppler assessment advised in this
regard.

2. Smaller left ovarian cyst, may represent a dominant follicle.
Minimal surrounding fluid is seen in this area as well. There may
have been recent left ovarian cyst rupture.

3. Ascites tracks into the anterior pelvis and partially surrounds
the bladder. Question fluid from the suspected ovarian cyst
leakage/rupture or posteriorly. Minimal ascites is seen adjacent to
the gallbladder in the right upper abdomen.

4. Appendix appears unremarkable. No bowel obstruction. No abscess
in the abdomen or pelvis.

5. Mildly prominent venous structures between the left kidney and
spleen. No similar findings elsewhere. Etiology for this venous
prominence is uncertain. Spleen is normal in size and contour.

## 2021-10-19 IMAGING — US US ART/VEN ABD/PELV/SCROTUM DOPPLER LTD
1 series · 13 of 25 positions shown · non-contrast
Comparison: CT abdomen pelvis from same day.

CLINICAL DATA: Right lower quadrant pain. Right ovarian cyst seen
on CT.

EXAM:
TRANSABDOMINAL ULTRASOUND OF PELVIS
DOPPLER ULTRASOUND OF OVARIES
TECHNIQUE: Transabdominal ultrasound examination of the pelvis was performed
including evaluation of the uterus, ovaries, adnexal regions, and
pelvic cul-de-sac.
Color and duplex Doppler ultrasound was utilized to evaluate blood
flow to the ovaries.

[Series 1: us art/ven abd/pelv/scrotum doppler ltd · 52 acquisitions, 13 frames shown]
[im 1/52]
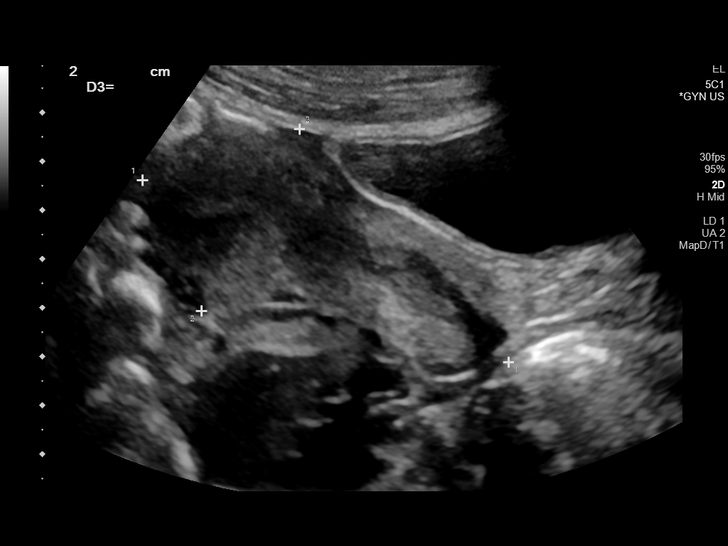
[im 5/52]
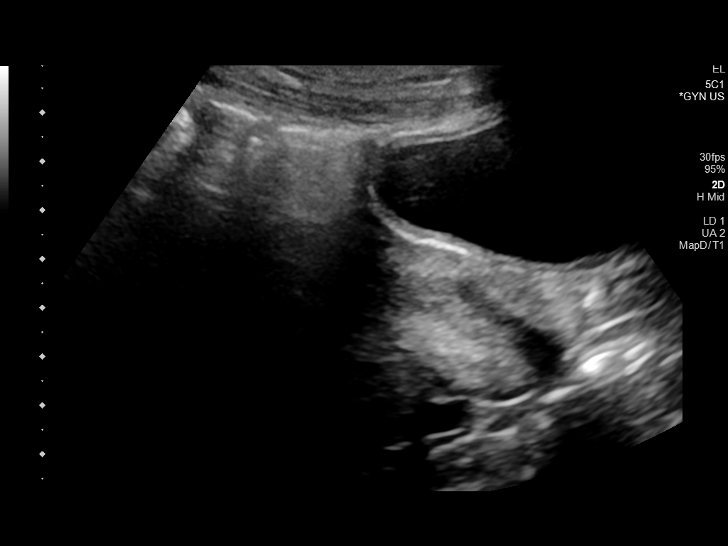
[im 9/52]
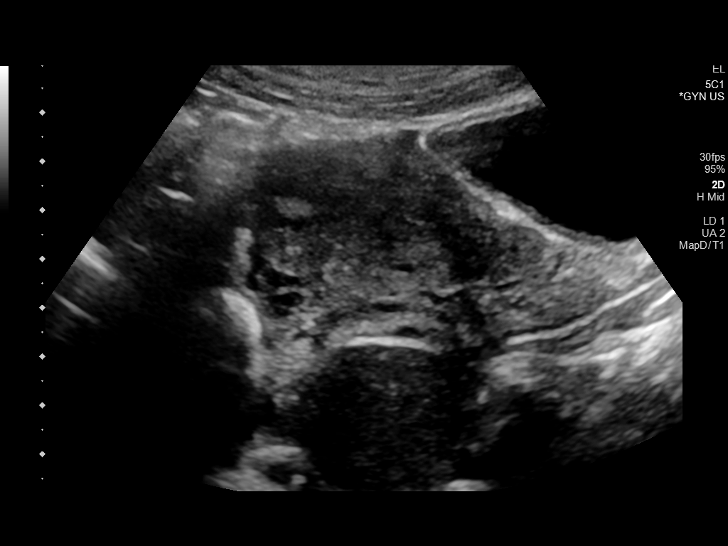
[im 13/52]
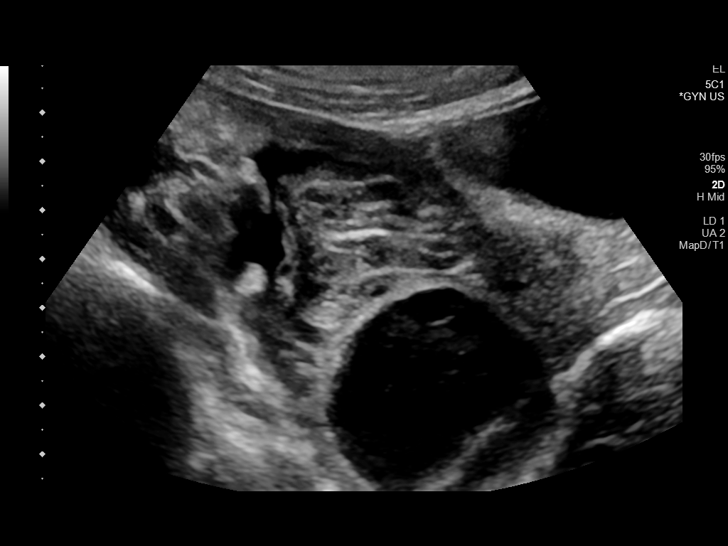
[im 18/52]
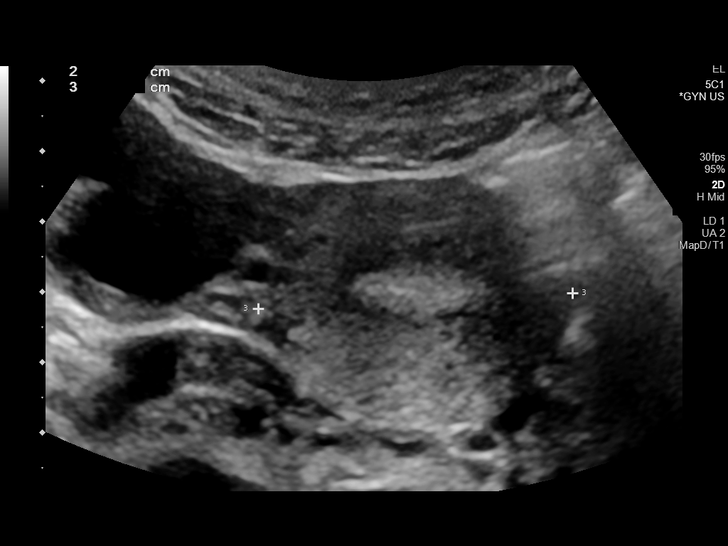
[im 22/52]
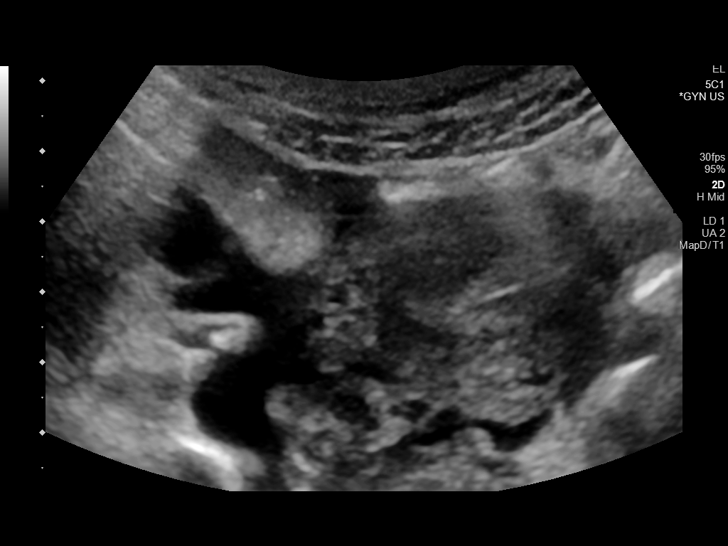
[im 26/52]
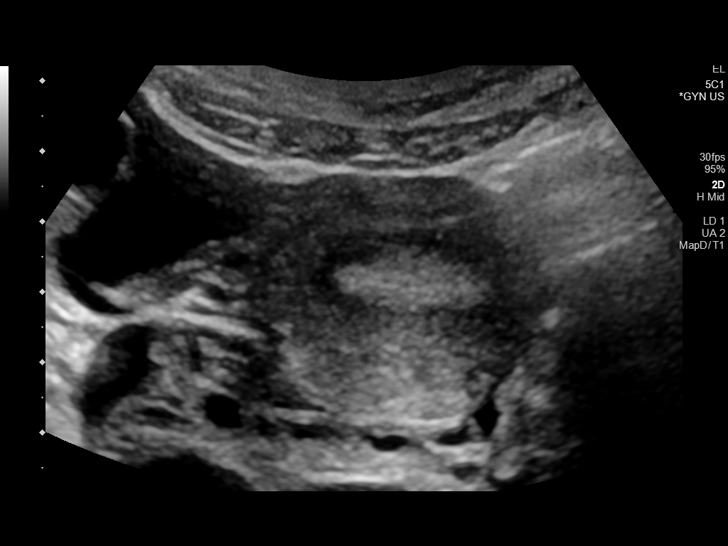
[im 30/52]
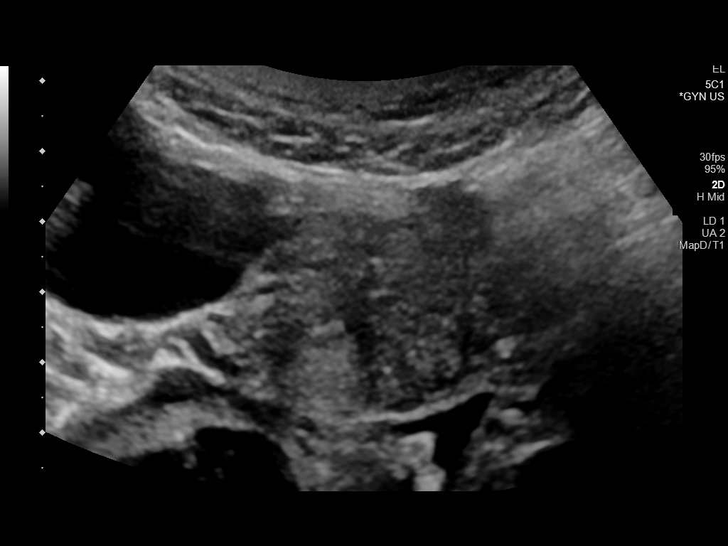
[im 35/52]
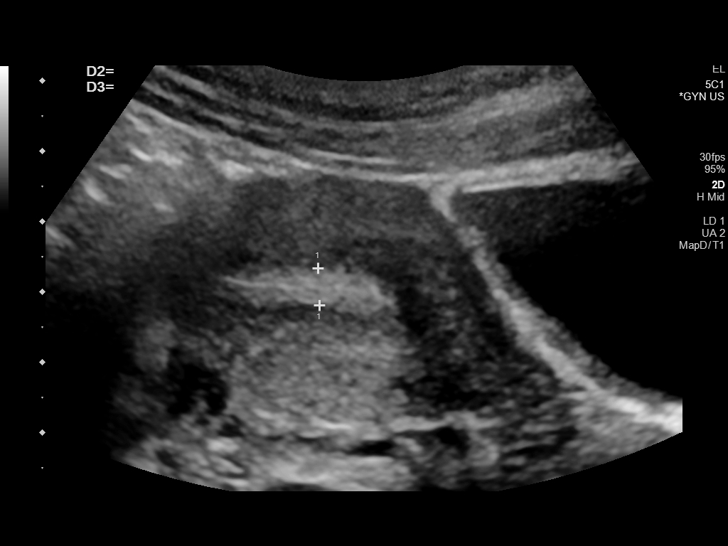
[im 39/52]
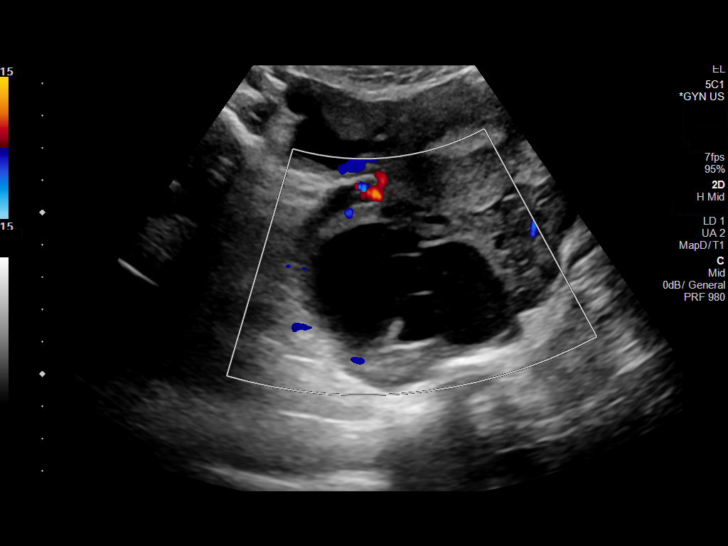
[im 43/52]
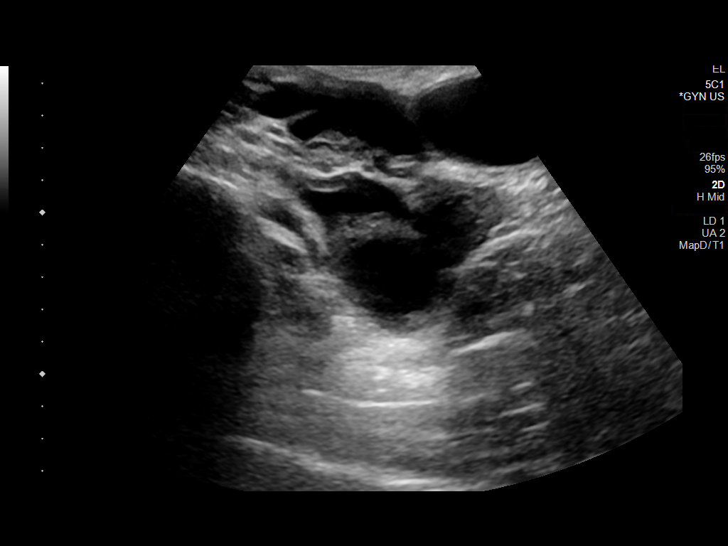
[im 47/52]
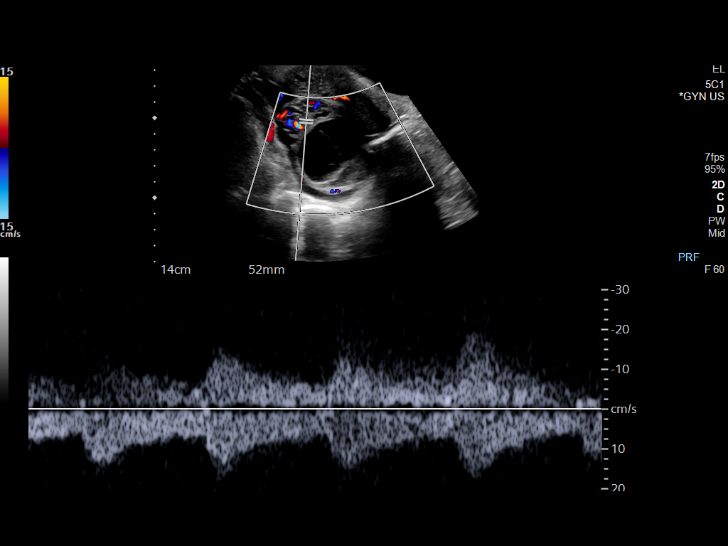
[im 52/52]
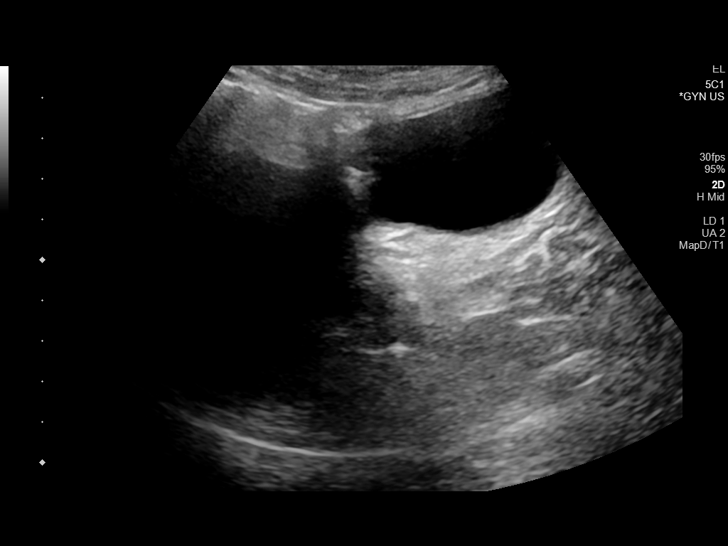

[13 of 25 positions shown; findings below may reference images not displayed]

FINDINGS: Uterus

Measurements: 8.4 x 2.2 x 4.5 cm = volume: 83 mL. No fibroids or
other mass visualized.

Endometrium

Thickness: 5 mm.  No focal abnormality visualized.

Right ovary

Measurements: 5.1 x 4.9 x 7.5 cm = volume: 98 mL. There is a 4.2 x
3.7 x 5.2 cm complex cyst with reticular pattern of internal echoes,
consistent with a hemorrhagic cyst.

Left ovary

Not visualized.

Pulsed Doppler evaluation demonstrates normal low-resistance
arterial and venous waveforms in the right ovary.

Other: None.
IMPRESSION: 1. No evidence of right ovarian torsion.
2. 5.2 cm hemorrhagic cyst in the right ovary. Follow-up pelvic
ultrasound in 6-12 weeks is recommended to ensure resolution. This
recommendation follows the consensus statement: Management of
Asymptomatic Ovarian and Other Adnexal Cysts Imaged at [place]: Society
of Radiologists in Ultrasound Consensus Conference Statement.
3. Nonvisualization of the left ovary.

## 2021-10-19 IMAGING — US US PELVIS COMPLETE
1 series · 13 of 25 positions shown · non-contrast
Comparison: CT abdomen pelvis from same day.

CLINICAL DATA: Right lower quadrant pain. Right ovarian cyst seen
on CT.

EXAM:
TRANSABDOMINAL ULTRASOUND OF PELVIS
DOPPLER ULTRASOUND OF OVARIES
TECHNIQUE: Transabdominal ultrasound examination of the pelvis was performed
including evaluation of the uterus, ovaries, adnexal regions, and
pelvic cul-de-sac.
Color and duplex Doppler ultrasound was utilized to evaluate blood
flow to the ovaries.

[Series 1: us pelvis complete · 52 acquisitions, 13 frames shown]
[im 1/52]
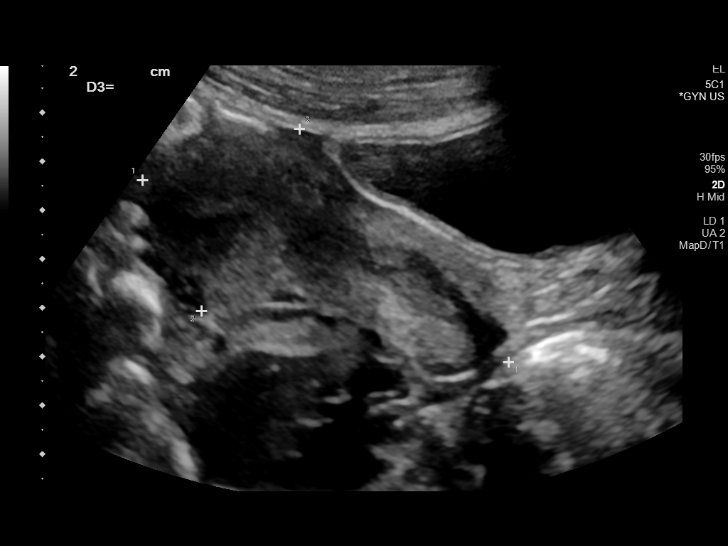
[im 5/52]
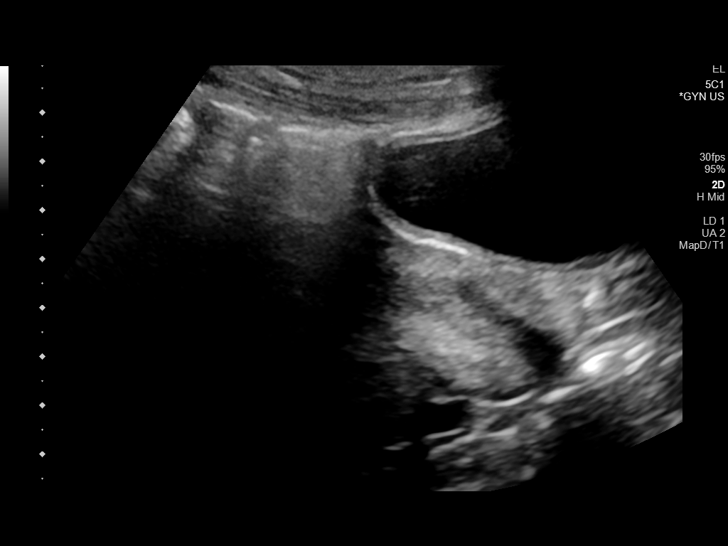
[im 9/52]
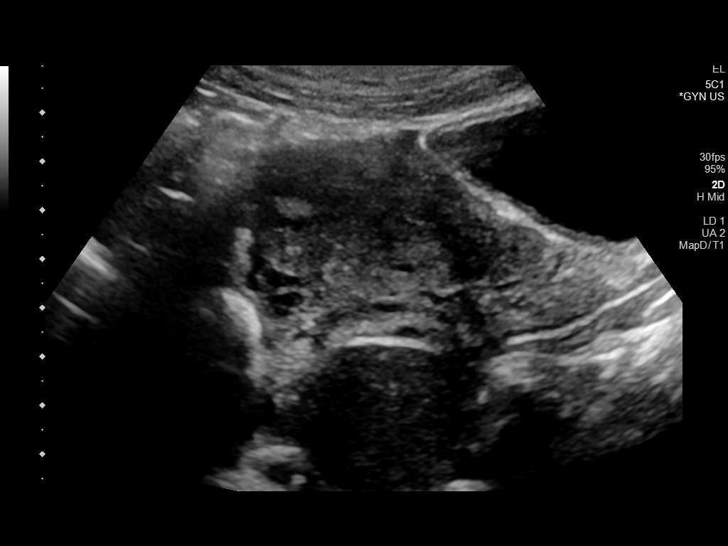
[im 13/52]
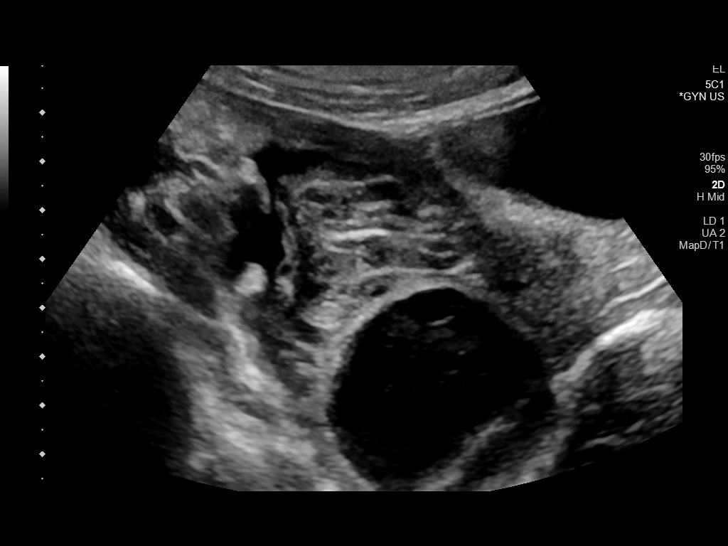
[im 18/52]
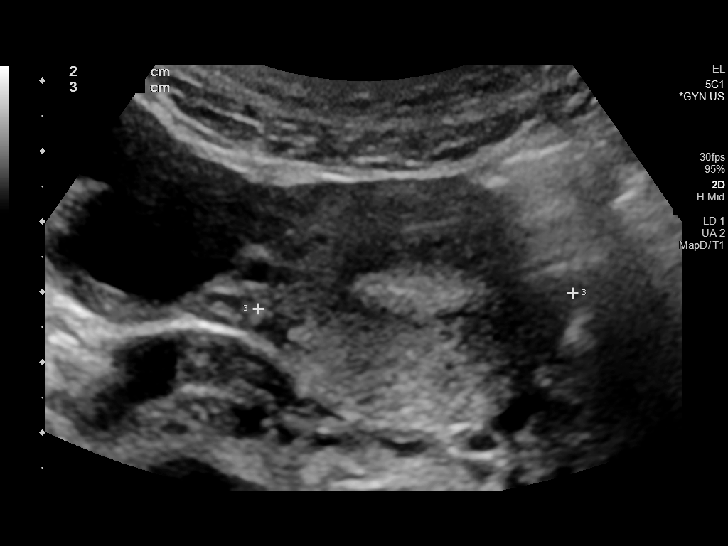
[im 22/52]
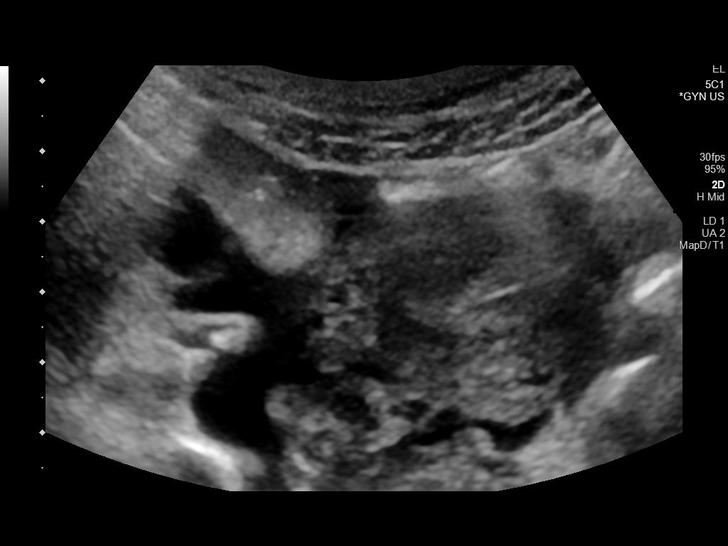
[im 26/52]
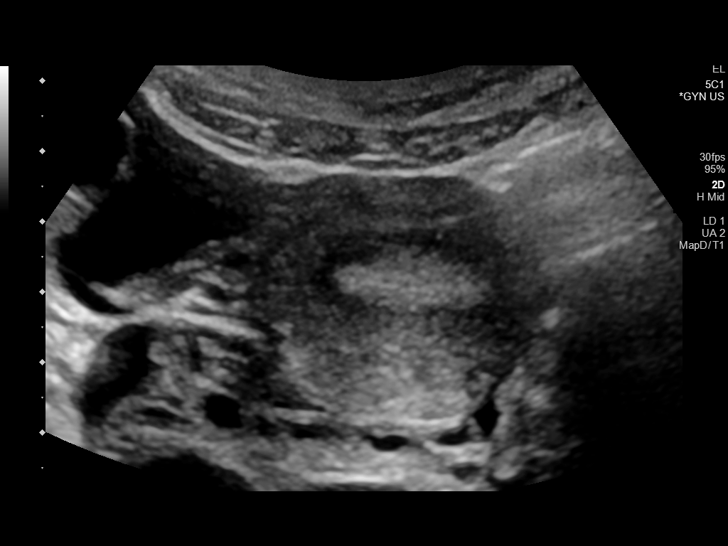
[im 30/52]
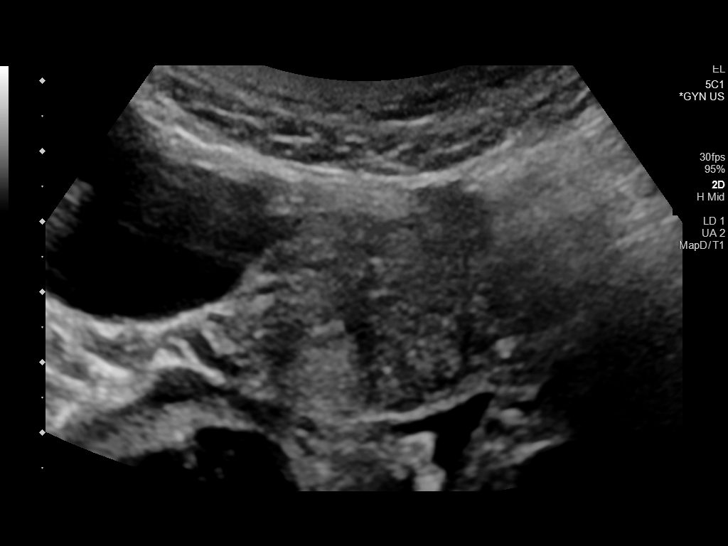
[im 35/52]
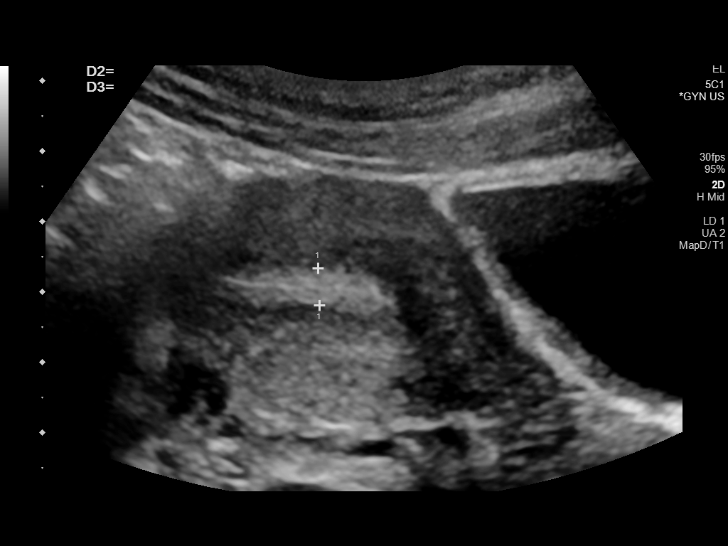
[im 39/52]
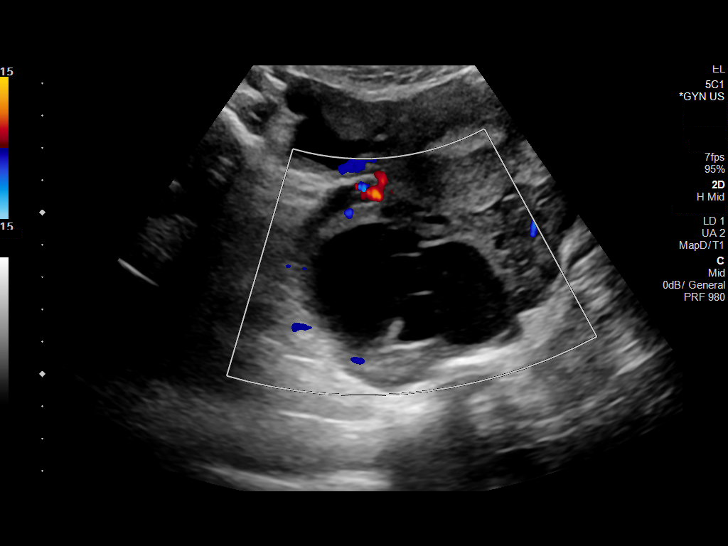
[im 43/52]
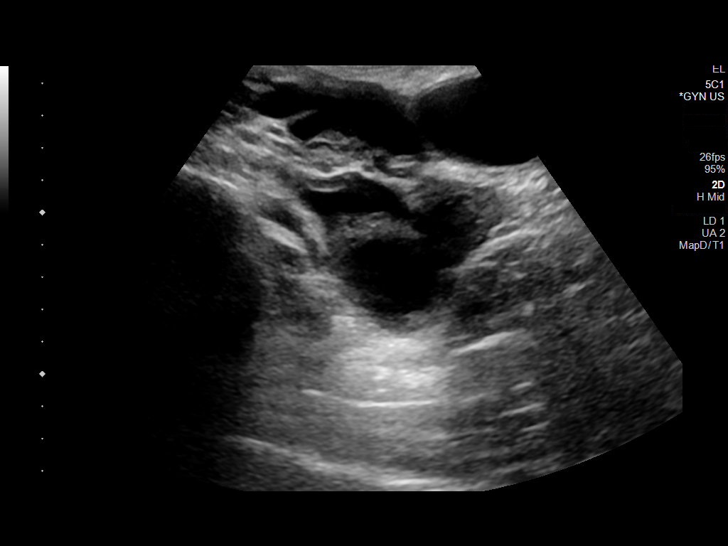
[im 47/52]
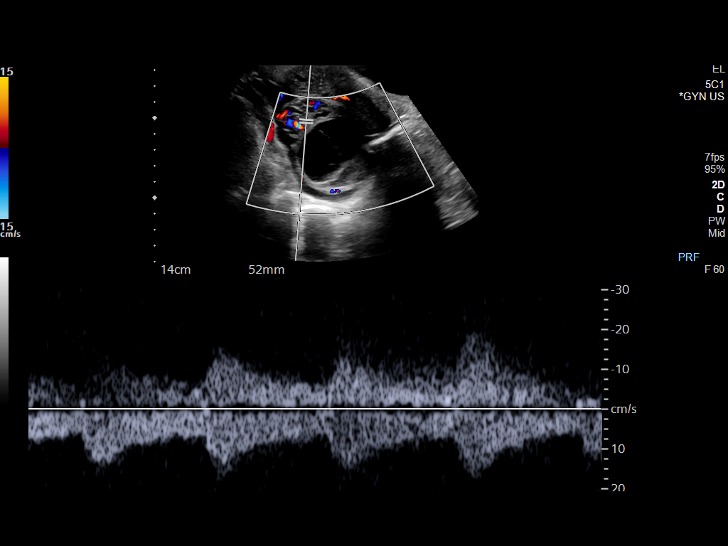
[im 52/52]
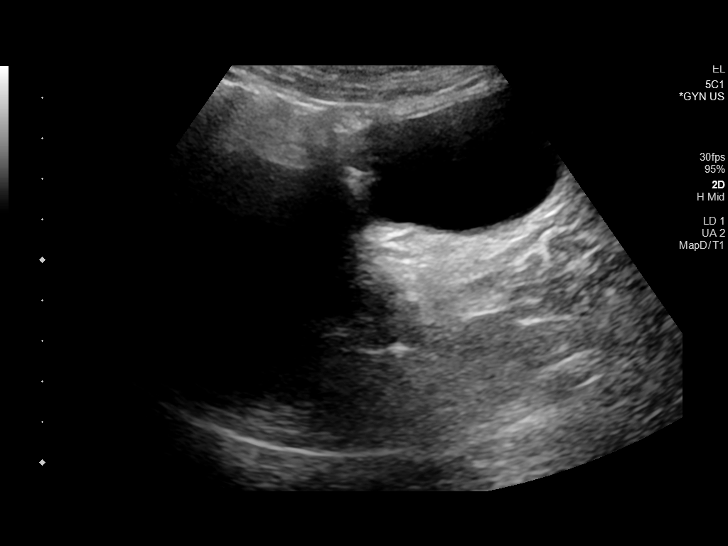

[13 of 25 positions shown; findings below may reference images not displayed]

FINDINGS: Uterus

Measurements: 8.4 x 2.2 x 4.5 cm = volume: 83 mL. No fibroids or
other mass visualized.

Endometrium

Thickness: 5 mm.  No focal abnormality visualized.

Right ovary

Measurements: 5.1 x 4.9 x 7.5 cm = volume: 98 mL. There is a 4.2 x
3.7 x 5.2 cm complex cyst with reticular pattern of internal echoes,
consistent with a hemorrhagic cyst.

Left ovary

Not visualized.

Pulsed Doppler evaluation demonstrates normal low-resistance
arterial and venous waveforms in the right ovary.

Other: None.
IMPRESSION: 1. No evidence of right ovarian torsion.
2. 5.2 cm hemorrhagic cyst in the right ovary. Follow-up pelvic
ultrasound in 6-12 weeks is recommended to ensure resolution. This
recommendation follows the consensus statement: Management of
Asymptomatic Ovarian and Other Adnexal Cysts Imaged at [place]: Society
of Radiologists in Ultrasound Consensus Conference Statement.
3. Nonvisualization of the left ovary.

## 2021-10-21 DIAGNOSIS — F411 Generalized anxiety disorder: Secondary | ICD-10-CM | POA: Diagnosis not present

## 2021-10-21 DIAGNOSIS — F840 Autistic disorder: Secondary | ICD-10-CM | POA: Diagnosis not present

## 2021-10-26 DIAGNOSIS — F4322 Adjustment disorder with anxiety: Secondary | ICD-10-CM | POA: Diagnosis not present

## 2021-10-28 DIAGNOSIS — F840 Autistic disorder: Secondary | ICD-10-CM | POA: Diagnosis not present

## 2021-10-28 DIAGNOSIS — F411 Generalized anxiety disorder: Secondary | ICD-10-CM | POA: Diagnosis not present

## 2021-11-02 DIAGNOSIS — F4322 Adjustment disorder with anxiety: Secondary | ICD-10-CM | POA: Diagnosis not present

## 2021-11-09 DIAGNOSIS — F4322 Adjustment disorder with anxiety: Secondary | ICD-10-CM | POA: Diagnosis not present

## 2021-11-11 DIAGNOSIS — F411 Generalized anxiety disorder: Secondary | ICD-10-CM | POA: Diagnosis not present

## 2021-11-11 DIAGNOSIS — F840 Autistic disorder: Secondary | ICD-10-CM | POA: Diagnosis not present

## 2021-11-18 DIAGNOSIS — F840 Autistic disorder: Secondary | ICD-10-CM | POA: Diagnosis not present

## 2021-11-18 DIAGNOSIS — F411 Generalized anxiety disorder: Secondary | ICD-10-CM | POA: Diagnosis not present

## 2021-11-23 DIAGNOSIS — F4322 Adjustment disorder with anxiety: Secondary | ICD-10-CM | POA: Diagnosis not present

## 2021-12-02 DIAGNOSIS — F411 Generalized anxiety disorder: Secondary | ICD-10-CM | POA: Diagnosis not present

## 2021-12-02 DIAGNOSIS — F840 Autistic disorder: Secondary | ICD-10-CM | POA: Diagnosis not present

## 2021-12-07 DIAGNOSIS — F4322 Adjustment disorder with anxiety: Secondary | ICD-10-CM | POA: Diagnosis not present

## 2021-12-09 DIAGNOSIS — F840 Autistic disorder: Secondary | ICD-10-CM | POA: Diagnosis not present

## 2021-12-09 DIAGNOSIS — F411 Generalized anxiety disorder: Secondary | ICD-10-CM | POA: Diagnosis not present

## 2021-12-14 DIAGNOSIS — F4322 Adjustment disorder with anxiety: Secondary | ICD-10-CM | POA: Diagnosis not present

## 2021-12-16 DIAGNOSIS — F411 Generalized anxiety disorder: Secondary | ICD-10-CM | POA: Diagnosis not present

## 2021-12-16 DIAGNOSIS — F840 Autistic disorder: Secondary | ICD-10-CM | POA: Diagnosis not present

## 2021-12-21 DIAGNOSIS — F4322 Adjustment disorder with anxiety: Secondary | ICD-10-CM | POA: Diagnosis not present

## 2021-12-23 DIAGNOSIS — F411 Generalized anxiety disorder: Secondary | ICD-10-CM | POA: Diagnosis not present

## 2021-12-23 DIAGNOSIS — F840 Autistic disorder: Secondary | ICD-10-CM | POA: Diagnosis not present

## 2021-12-28 DIAGNOSIS — F4322 Adjustment disorder with anxiety: Secondary | ICD-10-CM | POA: Diagnosis not present

## 2022-01-04 DIAGNOSIS — F4322 Adjustment disorder with anxiety: Secondary | ICD-10-CM | POA: Diagnosis not present

## 2022-01-06 DIAGNOSIS — F840 Autistic disorder: Secondary | ICD-10-CM | POA: Diagnosis not present

## 2022-01-06 DIAGNOSIS — F411 Generalized anxiety disorder: Secondary | ICD-10-CM | POA: Diagnosis not present

## 2022-01-11 DIAGNOSIS — F4322 Adjustment disorder with anxiety: Secondary | ICD-10-CM | POA: Diagnosis not present

## 2022-01-13 DIAGNOSIS — F840 Autistic disorder: Secondary | ICD-10-CM | POA: Diagnosis not present

## 2022-01-13 DIAGNOSIS — F411 Generalized anxiety disorder: Secondary | ICD-10-CM | POA: Diagnosis not present

## 2022-01-18 DIAGNOSIS — F4322 Adjustment disorder with anxiety: Secondary | ICD-10-CM | POA: Diagnosis not present

## 2022-01-20 DIAGNOSIS — F840 Autistic disorder: Secondary | ICD-10-CM | POA: Diagnosis not present

## 2022-01-20 DIAGNOSIS — F411 Generalized anxiety disorder: Secondary | ICD-10-CM | POA: Diagnosis not present

## 2022-01-25 DIAGNOSIS — F4322 Adjustment disorder with anxiety: Secondary | ICD-10-CM | POA: Diagnosis not present

## 2022-02-01 DIAGNOSIS — F4322 Adjustment disorder with anxiety: Secondary | ICD-10-CM | POA: Diagnosis not present

## 2022-02-03 DIAGNOSIS — F411 Generalized anxiety disorder: Secondary | ICD-10-CM | POA: Diagnosis not present

## 2022-02-03 DIAGNOSIS — F840 Autistic disorder: Secondary | ICD-10-CM | POA: Diagnosis not present

## 2022-02-08 DIAGNOSIS — F4322 Adjustment disorder with anxiety: Secondary | ICD-10-CM | POA: Diagnosis not present

## 2022-02-08 DIAGNOSIS — Z0000 Encounter for general adult medical examination without abnormal findings: Secondary | ICD-10-CM | POA: Diagnosis not present

## 2022-02-10 DIAGNOSIS — F840 Autistic disorder: Secondary | ICD-10-CM | POA: Diagnosis not present

## 2022-02-10 DIAGNOSIS — F411 Generalized anxiety disorder: Secondary | ICD-10-CM | POA: Diagnosis not present

## 2022-02-15 DIAGNOSIS — F4322 Adjustment disorder with anxiety: Secondary | ICD-10-CM | POA: Diagnosis not present

## 2022-02-17 DIAGNOSIS — F411 Generalized anxiety disorder: Secondary | ICD-10-CM | POA: Diagnosis not present

## 2022-02-17 DIAGNOSIS — F840 Autistic disorder: Secondary | ICD-10-CM | POA: Diagnosis not present

## 2022-02-22 DIAGNOSIS — F4322 Adjustment disorder with anxiety: Secondary | ICD-10-CM | POA: Diagnosis not present

## 2022-03-01 DIAGNOSIS — F4322 Adjustment disorder with anxiety: Secondary | ICD-10-CM | POA: Diagnosis not present

## 2022-03-03 DIAGNOSIS — F411 Generalized anxiety disorder: Secondary | ICD-10-CM | POA: Diagnosis not present

## 2022-03-03 DIAGNOSIS — F840 Autistic disorder: Secondary | ICD-10-CM | POA: Diagnosis not present

## 2022-03-08 DIAGNOSIS — F4322 Adjustment disorder with anxiety: Secondary | ICD-10-CM | POA: Diagnosis not present

## 2022-03-10 DIAGNOSIS — F411 Generalized anxiety disorder: Secondary | ICD-10-CM | POA: Diagnosis not present

## 2022-03-10 DIAGNOSIS — F840 Autistic disorder: Secondary | ICD-10-CM | POA: Diagnosis not present

## 2022-03-17 DIAGNOSIS — H04123 Dry eye syndrome of bilateral lacrimal glands: Secondary | ICD-10-CM | POA: Diagnosis not present

## 2022-03-22 DIAGNOSIS — F4322 Adjustment disorder with anxiety: Secondary | ICD-10-CM | POA: Diagnosis not present

## 2022-03-24 DIAGNOSIS — F840 Autistic disorder: Secondary | ICD-10-CM | POA: Diagnosis not present

## 2022-03-24 DIAGNOSIS — F411 Generalized anxiety disorder: Secondary | ICD-10-CM | POA: Diagnosis not present

## 2022-03-29 DIAGNOSIS — F4322 Adjustment disorder with anxiety: Secondary | ICD-10-CM | POA: Diagnosis not present

## 2022-04-05 DIAGNOSIS — F4322 Adjustment disorder with anxiety: Secondary | ICD-10-CM | POA: Diagnosis not present

## 2022-04-12 DIAGNOSIS — F4322 Adjustment disorder with anxiety: Secondary | ICD-10-CM | POA: Diagnosis not present

## 2022-04-19 DIAGNOSIS — F4322 Adjustment disorder with anxiety: Secondary | ICD-10-CM | POA: Diagnosis not present

## 2022-04-21 DIAGNOSIS — F840 Autistic disorder: Secondary | ICD-10-CM | POA: Diagnosis not present

## 2022-04-21 DIAGNOSIS — F411 Generalized anxiety disorder: Secondary | ICD-10-CM | POA: Diagnosis not present

## 2022-04-26 DIAGNOSIS — F4322 Adjustment disorder with anxiety: Secondary | ICD-10-CM | POA: Diagnosis not present

## 2022-04-28 DIAGNOSIS — F840 Autistic disorder: Secondary | ICD-10-CM | POA: Diagnosis not present

## 2022-04-28 DIAGNOSIS — F411 Generalized anxiety disorder: Secondary | ICD-10-CM | POA: Diagnosis not present

## 2022-05-03 DIAGNOSIS — F4322 Adjustment disorder with anxiety: Secondary | ICD-10-CM | POA: Diagnosis not present

## 2022-05-16 DIAGNOSIS — U071 COVID-19: Secondary | ICD-10-CM | POA: Diagnosis not present

## 2022-05-16 DIAGNOSIS — J029 Acute pharyngitis, unspecified: Secondary | ICD-10-CM | POA: Diagnosis not present

## 2022-05-16 NOTE — Progress Notes (Signed)
 Subjective Patient ID: Regina Vega is a 37 y.o. female who presents to clinic with her dad for treatment and evaluation of sore throat, ear problem and some flulike symptoms that started Friday.  Reports took an at-home COVID test this morning which was positive.  States throat is most bothersome currently.  Denies any fever, chills, chest pain or shortness of breath, cough, abdominal pain, nausea, vomiting, diarrhea, rash or urinary complaints.  The following portions of the patient's history were reviewed and updated as appropriate: allergies, current medications, past family history, past medical history, past social history, past surgical history and problem list.  Review of Systems  Constitutional: Negative.   HENT: Positive for congestion, ear pain and sore throat.   Respiratory: Negative.   Cardiovascular: Negative.   Gastrointestinal: Negative.   Genitourinary: Negative.   Musculoskeletal: Negative.   Skin: Negative.   Neurological: Negative.      Objective   BP 120/95 (Site: Left arm, Position: Sitting)   Pulse 92   Temp 98.6 F (37 C) (Oral)   Resp 20   Ht 1.651 m (5' 5)   Wt (!) 40.8 kg (90 lb)   LMP 04/17/2022 (Approximate)   SpO2 100%   BMI 14.98 kg/m   Physical Exam Vitals and nursing note reviewed.  Constitutional:      General: She is not in acute distress.    Appearance: Normal appearance. She is not ill-appearing, toxic-appearing or diaphoretic.  HENT:     Head: Normocephalic and atraumatic.     Right Ear: Tympanic membrane and external ear normal.     Left Ear: Tympanic membrane and external ear normal.     Nose: Congestion present. No rhinorrhea.     Mouth/Throat:     Mouth: Mucous membranes are moist.     Pharynx: Oropharynx is clear. Posterior oropharyngeal erythema present. No oropharyngeal exudate.  Eyes:     Conjunctiva/sclera: Conjunctivae normal.     Pupils: Pupils are equal, round, and reactive to light.  Cardiovascular:     Rate and  Rhythm: Normal rate and regular rhythm.     Pulses: Normal pulses.     Heart sounds: Normal heart sounds.  Pulmonary:     Effort: Pulmonary effort is normal.     Breath sounds: Normal breath sounds.  Musculoskeletal:        General: Normal range of motion.     Cervical back: Normal range of motion and neck supple. No rigidity or tenderness.  Lymphadenopathy:     Cervical: No cervical adenopathy.  Skin:    General: Skin is warm and dry.     Capillary Refill: Capillary refill takes less than 2 seconds.     Findings: No erythema or rash.  Neurological:     General: No focal deficit present.     Mental Status: She is alert and oriented to person, place, and time.  Psychiatric:        Mood and Affect: Mood normal.        Behavior: Behavior normal.      Assessment   Encounter Diagnoses  Name Primary?  . Sore throat Yes  . COVID      Plan   Impression/Plan 1. Sore throat - POCT Rapid Strep A  2. COVID   COVID-positive test at home this morning per patient and dad.  Rapid strep negative.  Suspect patient's sore throat and ear discomfort are likely due to her COVID viral infection.  Discussed over-the-counter symptomatic management.  Offered Magic mouthwash  for the sore throat however patient declined.  Rest and push plenty of fluids.  Discussed strict return and ED precautions.  Discussed hand hygiene, infection control measures as well as quarantine guidelines.  Patient to follow-up with primary care. Orders Placed This Encounter  Procedures  . POCT Rapid Strep A     Patient has been instructed on prescription and/or over-the-counter medications, dosages, side effects, and possible interactions as associated with each diagnosis in my impression and plan above.  2.  I have educated patient on supportive care therapy associated with the diagnoses above.  3.  Patient was instructed on when to follow up and know that they can follow up here, with their PCP, Urgent Care, ED.  They  have been instructed that if symptoms worse that should return to the clinic, go to the nearest ED, or activate EMS.  4.  Red Flags associated with their diagnoses were reviewed and patient was educated on what to do if red flags develop.  5.  Patient agreed with plan and verbalized understanding.    Urgent Care Follow Up: Follow up with PCP   If labs were completed today, your lab(s) have been sent to our main campus laboratory.  Based on the testing ordered and lab volume it can take up to 3-5 days for results to return. We will call you with any results that require attention.  We do not call negative/normal results.  All results will be available in your [facility] account.   Discussed treatment plan with patient.  Patient verbalized understanding and had no questions upon discharge.  Patient stable and in NAD distress upon discharge from clinic.  Contact clinic with any questions or concerns.  Portions of this note have been dictated using Dragon voice to text software. Errors may be present despite proofreading.      Electronically signed by: Lauraine Dionne Sharps, PA-C 05/16/2022 8:03 AM    Electronically signed by: Sharps Lauraine Dionne, PA-C 05/16/22 [phone]

## 2022-05-24 DIAGNOSIS — F4322 Adjustment disorder with anxiety: Secondary | ICD-10-CM | POA: Diagnosis not present

## 2022-05-26 DIAGNOSIS — F840 Autistic disorder: Secondary | ICD-10-CM | POA: Diagnosis not present

## 2022-05-26 DIAGNOSIS — F411 Generalized anxiety disorder: Secondary | ICD-10-CM | POA: Diagnosis not present

## 2022-05-31 DIAGNOSIS — F4322 Adjustment disorder with anxiety: Secondary | ICD-10-CM | POA: Diagnosis not present

## 2022-06-02 DIAGNOSIS — F411 Generalized anxiety disorder: Secondary | ICD-10-CM | POA: Diagnosis not present

## 2022-06-02 DIAGNOSIS — F840 Autistic disorder: Secondary | ICD-10-CM | POA: Diagnosis not present

## 2022-06-07 DIAGNOSIS — F4322 Adjustment disorder with anxiety: Secondary | ICD-10-CM | POA: Diagnosis not present

## 2022-06-09 DIAGNOSIS — F411 Generalized anxiety disorder: Secondary | ICD-10-CM | POA: Diagnosis not present

## 2022-06-09 DIAGNOSIS — F840 Autistic disorder: Secondary | ICD-10-CM | POA: Diagnosis not present

## 2022-06-14 DIAGNOSIS — F4322 Adjustment disorder with anxiety: Secondary | ICD-10-CM | POA: Diagnosis not present

## 2022-06-16 DIAGNOSIS — F840 Autistic disorder: Secondary | ICD-10-CM | POA: Diagnosis not present

## 2022-06-16 DIAGNOSIS — F411 Generalized anxiety disorder: Secondary | ICD-10-CM | POA: Diagnosis not present

## 2022-06-21 DIAGNOSIS — F4322 Adjustment disorder with anxiety: Secondary | ICD-10-CM | POA: Diagnosis not present

## 2022-06-23 DIAGNOSIS — F411 Generalized anxiety disorder: Secondary | ICD-10-CM | POA: Diagnosis not present

## 2022-06-23 DIAGNOSIS — F840 Autistic disorder: Secondary | ICD-10-CM | POA: Diagnosis not present

## 2022-06-28 DIAGNOSIS — F4322 Adjustment disorder with anxiety: Secondary | ICD-10-CM | POA: Diagnosis not present

## 2022-06-30 DIAGNOSIS — F840 Autistic disorder: Secondary | ICD-10-CM | POA: Diagnosis not present

## 2022-06-30 DIAGNOSIS — F411 Generalized anxiety disorder: Secondary | ICD-10-CM | POA: Diagnosis not present

## 2022-07-05 DIAGNOSIS — F4322 Adjustment disorder with anxiety: Secondary | ICD-10-CM | POA: Diagnosis not present

## 2022-07-07 DIAGNOSIS — F411 Generalized anxiety disorder: Secondary | ICD-10-CM | POA: Diagnosis not present

## 2022-07-07 DIAGNOSIS — F840 Autistic disorder: Secondary | ICD-10-CM | POA: Diagnosis not present

## 2022-07-12 DIAGNOSIS — F4322 Adjustment disorder with anxiety: Secondary | ICD-10-CM | POA: Diagnosis not present

## 2022-07-14 DIAGNOSIS — F840 Autistic disorder: Secondary | ICD-10-CM | POA: Diagnosis not present

## 2022-07-14 DIAGNOSIS — F411 Generalized anxiety disorder: Secondary | ICD-10-CM | POA: Diagnosis not present

## 2022-07-19 DIAGNOSIS — F4322 Adjustment disorder with anxiety: Secondary | ICD-10-CM | POA: Diagnosis not present

## 2022-07-26 DIAGNOSIS — F4322 Adjustment disorder with anxiety: Secondary | ICD-10-CM | POA: Diagnosis not present

## 2022-07-28 DIAGNOSIS — F411 Generalized anxiety disorder: Secondary | ICD-10-CM | POA: Diagnosis not present

## 2022-07-28 DIAGNOSIS — F840 Autistic disorder: Secondary | ICD-10-CM | POA: Diagnosis not present

## 2022-08-04 DIAGNOSIS — F411 Generalized anxiety disorder: Secondary | ICD-10-CM | POA: Diagnosis not present

## 2022-08-04 DIAGNOSIS — F840 Autistic disorder: Secondary | ICD-10-CM | POA: Diagnosis not present

## 2022-08-09 DIAGNOSIS — F4322 Adjustment disorder with anxiety: Secondary | ICD-10-CM | POA: Diagnosis not present

## 2022-08-16 DIAGNOSIS — F4322 Adjustment disorder with anxiety: Secondary | ICD-10-CM | POA: Diagnosis not present

## 2022-08-18 DIAGNOSIS — F411 Generalized anxiety disorder: Secondary | ICD-10-CM | POA: Diagnosis not present

## 2022-08-18 DIAGNOSIS — F840 Autistic disorder: Secondary | ICD-10-CM | POA: Diagnosis not present

## 2022-08-23 DIAGNOSIS — F4322 Adjustment disorder with anxiety: Secondary | ICD-10-CM | POA: Diagnosis not present

## 2022-08-25 DIAGNOSIS — F411 Generalized anxiety disorder: Secondary | ICD-10-CM | POA: Diagnosis not present

## 2022-08-25 DIAGNOSIS — F840 Autistic disorder: Secondary | ICD-10-CM | POA: Diagnosis not present

## 2022-08-30 DIAGNOSIS — F4322 Adjustment disorder with anxiety: Secondary | ICD-10-CM | POA: Diagnosis not present

## 2022-09-06 DIAGNOSIS — F4322 Adjustment disorder with anxiety: Secondary | ICD-10-CM | POA: Diagnosis not present

## 2022-09-13 DIAGNOSIS — F4322 Adjustment disorder with anxiety: Secondary | ICD-10-CM | POA: Diagnosis not present

## 2022-09-20 DIAGNOSIS — F4322 Adjustment disorder with anxiety: Secondary | ICD-10-CM | POA: Diagnosis not present

## 2022-09-22 DIAGNOSIS — F411 Generalized anxiety disorder: Secondary | ICD-10-CM | POA: Diagnosis not present

## 2022-09-22 DIAGNOSIS — F840 Autistic disorder: Secondary | ICD-10-CM | POA: Diagnosis not present

## 2022-09-27 DIAGNOSIS — F4322 Adjustment disorder with anxiety: Secondary | ICD-10-CM | POA: Diagnosis not present

## 2022-09-29 DIAGNOSIS — F840 Autistic disorder: Secondary | ICD-10-CM | POA: Diagnosis not present

## 2022-09-29 DIAGNOSIS — F411 Generalized anxiety disorder: Secondary | ICD-10-CM | POA: Diagnosis not present

## 2022-10-04 DIAGNOSIS — F4322 Adjustment disorder with anxiety: Secondary | ICD-10-CM | POA: Diagnosis not present

## 2022-10-06 DIAGNOSIS — F840 Autistic disorder: Secondary | ICD-10-CM | POA: Diagnosis not present

## 2022-10-06 DIAGNOSIS — F411 Generalized anxiety disorder: Secondary | ICD-10-CM | POA: Diagnosis not present

## 2022-10-11 DIAGNOSIS — F4322 Adjustment disorder with anxiety: Secondary | ICD-10-CM | POA: Diagnosis not present

## 2022-10-13 DIAGNOSIS — F411 Generalized anxiety disorder: Secondary | ICD-10-CM | POA: Diagnosis not present

## 2022-10-13 DIAGNOSIS — F840 Autistic disorder: Secondary | ICD-10-CM | POA: Diagnosis not present

## 2022-10-18 DIAGNOSIS — F4322 Adjustment disorder with anxiety: Secondary | ICD-10-CM | POA: Diagnosis not present

## 2022-10-20 DIAGNOSIS — F840 Autistic disorder: Secondary | ICD-10-CM | POA: Diagnosis not present

## 2022-10-20 DIAGNOSIS — F411 Generalized anxiety disorder: Secondary | ICD-10-CM | POA: Diagnosis not present

## 2022-10-25 DIAGNOSIS — F4322 Adjustment disorder with anxiety: Secondary | ICD-10-CM | POA: Diagnosis not present

## 2022-10-27 DIAGNOSIS — F840 Autistic disorder: Secondary | ICD-10-CM | POA: Diagnosis not present

## 2022-10-27 DIAGNOSIS — F411 Generalized anxiety disorder: Secondary | ICD-10-CM | POA: Diagnosis not present

## 2022-11-01 DIAGNOSIS — F4322 Adjustment disorder with anxiety: Secondary | ICD-10-CM | POA: Diagnosis not present

## 2022-11-03 DIAGNOSIS — F411 Generalized anxiety disorder: Secondary | ICD-10-CM | POA: Diagnosis not present

## 2022-11-03 DIAGNOSIS — F840 Autistic disorder: Secondary | ICD-10-CM | POA: Diagnosis not present

## 2022-11-08 DIAGNOSIS — F4322 Adjustment disorder with anxiety: Secondary | ICD-10-CM | POA: Diagnosis not present

## 2022-11-10 DIAGNOSIS — F411 Generalized anxiety disorder: Secondary | ICD-10-CM | POA: Diagnosis not present

## 2022-11-10 DIAGNOSIS — F840 Autistic disorder: Secondary | ICD-10-CM | POA: Diagnosis not present

## 2022-11-15 DIAGNOSIS — F4322 Adjustment disorder with anxiety: Secondary | ICD-10-CM | POA: Diagnosis not present

## 2022-11-22 DIAGNOSIS — F4322 Adjustment disorder with anxiety: Secondary | ICD-10-CM | POA: Diagnosis not present

## 2022-11-24 DIAGNOSIS — F411 Generalized anxiety disorder: Secondary | ICD-10-CM | POA: Diagnosis not present

## 2022-11-24 DIAGNOSIS — F840 Autistic disorder: Secondary | ICD-10-CM | POA: Diagnosis not present

## 2022-11-29 DIAGNOSIS — F4322 Adjustment disorder with anxiety: Secondary | ICD-10-CM | POA: Diagnosis not present

## 2022-12-01 DIAGNOSIS — F840 Autistic disorder: Secondary | ICD-10-CM | POA: Diagnosis not present

## 2022-12-01 DIAGNOSIS — F411 Generalized anxiety disorder: Secondary | ICD-10-CM | POA: Diagnosis not present

## 2022-12-06 DIAGNOSIS — F4322 Adjustment disorder with anxiety: Secondary | ICD-10-CM | POA: Diagnosis not present

## 2022-12-13 DIAGNOSIS — F4322 Adjustment disorder with anxiety: Secondary | ICD-10-CM | POA: Diagnosis not present

## 2022-12-15 DIAGNOSIS — F840 Autistic disorder: Secondary | ICD-10-CM | POA: Diagnosis not present

## 2022-12-15 DIAGNOSIS — F411 Generalized anxiety disorder: Secondary | ICD-10-CM | POA: Diagnosis not present

## 2022-12-20 DIAGNOSIS — F4322 Adjustment disorder with anxiety: Secondary | ICD-10-CM | POA: Diagnosis not present

## 2022-12-22 DIAGNOSIS — F411 Generalized anxiety disorder: Secondary | ICD-10-CM | POA: Diagnosis not present

## 2022-12-22 DIAGNOSIS — F840 Autistic disorder: Secondary | ICD-10-CM | POA: Diagnosis not present

## 2022-12-27 DIAGNOSIS — F4322 Adjustment disorder with anxiety: Secondary | ICD-10-CM | POA: Diagnosis not present

## 2022-12-29 DIAGNOSIS — F840 Autistic disorder: Secondary | ICD-10-CM | POA: Diagnosis not present

## 2022-12-29 DIAGNOSIS — F411 Generalized anxiety disorder: Secondary | ICD-10-CM | POA: Diagnosis not present

## 2023-01-03 DIAGNOSIS — F4322 Adjustment disorder with anxiety: Secondary | ICD-10-CM | POA: Diagnosis not present

## 2023-01-05 DIAGNOSIS — F840 Autistic disorder: Secondary | ICD-10-CM | POA: Diagnosis not present

## 2023-01-05 DIAGNOSIS — F411 Generalized anxiety disorder: Secondary | ICD-10-CM | POA: Diagnosis not present

## 2023-01-10 DIAGNOSIS — F4322 Adjustment disorder with anxiety: Secondary | ICD-10-CM | POA: Diagnosis not present

## 2023-01-12 DIAGNOSIS — F411 Generalized anxiety disorder: Secondary | ICD-10-CM | POA: Diagnosis not present

## 2023-01-12 DIAGNOSIS — F840 Autistic disorder: Secondary | ICD-10-CM | POA: Diagnosis not present

## 2023-01-17 DIAGNOSIS — F4322 Adjustment disorder with anxiety: Secondary | ICD-10-CM | POA: Diagnosis not present

## 2023-01-19 DIAGNOSIS — F411 Generalized anxiety disorder: Secondary | ICD-10-CM | POA: Diagnosis not present

## 2023-01-19 DIAGNOSIS — F840 Autistic disorder: Secondary | ICD-10-CM | POA: Diagnosis not present

## 2023-01-24 DIAGNOSIS — F4322 Adjustment disorder with anxiety: Secondary | ICD-10-CM | POA: Diagnosis not present

## 2023-01-26 DIAGNOSIS — F840 Autistic disorder: Secondary | ICD-10-CM | POA: Diagnosis not present

## 2023-01-26 DIAGNOSIS — F411 Generalized anxiety disorder: Secondary | ICD-10-CM | POA: Diagnosis not present

## 2023-02-07 DIAGNOSIS — F4322 Adjustment disorder with anxiety: Secondary | ICD-10-CM | POA: Diagnosis not present

## 2023-02-09 DIAGNOSIS — F411 Generalized anxiety disorder: Secondary | ICD-10-CM | POA: Diagnosis not present

## 2023-02-09 DIAGNOSIS — F840 Autistic disorder: Secondary | ICD-10-CM | POA: Diagnosis not present

## 2023-02-14 DIAGNOSIS — F4322 Adjustment disorder with anxiety: Secondary | ICD-10-CM | POA: Diagnosis not present

## 2023-02-21 DIAGNOSIS — F4322 Adjustment disorder with anxiety: Secondary | ICD-10-CM | POA: Diagnosis not present

## 2023-02-23 DIAGNOSIS — F840 Autistic disorder: Secondary | ICD-10-CM | POA: Diagnosis not present

## 2023-02-23 DIAGNOSIS — F411 Generalized anxiety disorder: Secondary | ICD-10-CM | POA: Diagnosis not present

## 2023-02-28 DIAGNOSIS — F4322 Adjustment disorder with anxiety: Secondary | ICD-10-CM | POA: Diagnosis not present

## 2023-03-02 DIAGNOSIS — F411 Generalized anxiety disorder: Secondary | ICD-10-CM | POA: Diagnosis not present

## 2023-03-02 DIAGNOSIS — F840 Autistic disorder: Secondary | ICD-10-CM | POA: Diagnosis not present

## 2023-03-07 DIAGNOSIS — F4322 Adjustment disorder with anxiety: Secondary | ICD-10-CM | POA: Diagnosis not present

## 2023-03-09 DIAGNOSIS — F840 Autistic disorder: Secondary | ICD-10-CM | POA: Diagnosis not present

## 2023-03-09 DIAGNOSIS — F411 Generalized anxiety disorder: Secondary | ICD-10-CM | POA: Diagnosis not present

## 2023-03-14 DIAGNOSIS — F4322 Adjustment disorder with anxiety: Secondary | ICD-10-CM | POA: Diagnosis not present

## 2023-03-16 DIAGNOSIS — F840 Autistic disorder: Secondary | ICD-10-CM | POA: Diagnosis not present

## 2023-03-16 DIAGNOSIS — F411 Generalized anxiety disorder: Secondary | ICD-10-CM | POA: Diagnosis not present

## 2023-03-21 DIAGNOSIS — F4322 Adjustment disorder with anxiety: Secondary | ICD-10-CM | POA: Diagnosis not present

## 2023-03-23 DIAGNOSIS — F411 Generalized anxiety disorder: Secondary | ICD-10-CM | POA: Diagnosis not present

## 2023-03-23 DIAGNOSIS — F840 Autistic disorder: Secondary | ICD-10-CM | POA: Diagnosis not present

## 2023-03-28 DIAGNOSIS — F4322 Adjustment disorder with anxiety: Secondary | ICD-10-CM | POA: Diagnosis not present

## 2023-04-04 DIAGNOSIS — F4322 Adjustment disorder with anxiety: Secondary | ICD-10-CM | POA: Diagnosis not present

## 2023-04-06 DIAGNOSIS — F840 Autistic disorder: Secondary | ICD-10-CM | POA: Diagnosis not present

## 2023-04-06 DIAGNOSIS — F411 Generalized anxiety disorder: Secondary | ICD-10-CM | POA: Diagnosis not present

## 2023-04-11 DIAGNOSIS — F4322 Adjustment disorder with anxiety: Secondary | ICD-10-CM | POA: Diagnosis not present

## 2023-04-18 DIAGNOSIS — F4322 Adjustment disorder with anxiety: Secondary | ICD-10-CM | POA: Diagnosis not present

## 2023-04-20 DIAGNOSIS — F840 Autistic disorder: Secondary | ICD-10-CM | POA: Diagnosis not present

## 2023-04-20 DIAGNOSIS — H04123 Dry eye syndrome of bilateral lacrimal glands: Secondary | ICD-10-CM | POA: Diagnosis not present

## 2023-04-20 DIAGNOSIS — F411 Generalized anxiety disorder: Secondary | ICD-10-CM | POA: Diagnosis not present

## 2023-04-27 DIAGNOSIS — F840 Autistic disorder: Secondary | ICD-10-CM | POA: Diagnosis not present

## 2023-04-27 DIAGNOSIS — F411 Generalized anxiety disorder: Secondary | ICD-10-CM | POA: Diagnosis not present

## 2023-05-02 DIAGNOSIS — F4322 Adjustment disorder with anxiety: Secondary | ICD-10-CM | POA: Diagnosis not present

## 2023-05-04 DIAGNOSIS — F840 Autistic disorder: Secondary | ICD-10-CM | POA: Diagnosis not present

## 2023-05-04 DIAGNOSIS — F411 Generalized anxiety disorder: Secondary | ICD-10-CM | POA: Diagnosis not present

## 2023-05-11 DIAGNOSIS — F411 Generalized anxiety disorder: Secondary | ICD-10-CM | POA: Diagnosis not present

## 2023-05-11 DIAGNOSIS — F840 Autistic disorder: Secondary | ICD-10-CM | POA: Diagnosis not present

## 2023-05-18 DIAGNOSIS — F411 Generalized anxiety disorder: Secondary | ICD-10-CM | POA: Diagnosis not present

## 2023-05-18 DIAGNOSIS — F840 Autistic disorder: Secondary | ICD-10-CM | POA: Diagnosis not present

## 2023-05-23 DIAGNOSIS — F4322 Adjustment disorder with anxiety: Secondary | ICD-10-CM | POA: Diagnosis not present

## 2023-05-25 DIAGNOSIS — F411 Generalized anxiety disorder: Secondary | ICD-10-CM | POA: Diagnosis not present

## 2023-05-25 DIAGNOSIS — F840 Autistic disorder: Secondary | ICD-10-CM | POA: Diagnosis not present

## 2023-06-01 DIAGNOSIS — F840 Autistic disorder: Secondary | ICD-10-CM | POA: Diagnosis not present

## 2023-06-01 DIAGNOSIS — F411 Generalized anxiety disorder: Secondary | ICD-10-CM | POA: Diagnosis not present

## 2023-06-06 DIAGNOSIS — F4322 Adjustment disorder with anxiety: Secondary | ICD-10-CM | POA: Diagnosis not present

## 2023-06-13 DIAGNOSIS — F4322 Adjustment disorder with anxiety: Secondary | ICD-10-CM | POA: Diagnosis not present

## 2023-06-20 DIAGNOSIS — F4322 Adjustment disorder with anxiety: Secondary | ICD-10-CM | POA: Diagnosis not present

## 2023-06-27 DIAGNOSIS — F4322 Adjustment disorder with anxiety: Secondary | ICD-10-CM | POA: Diagnosis not present

## 2023-06-29 DIAGNOSIS — F411 Generalized anxiety disorder: Secondary | ICD-10-CM | POA: Diagnosis not present

## 2023-06-29 DIAGNOSIS — F840 Autistic disorder: Secondary | ICD-10-CM | POA: Diagnosis not present

## 2023-07-04 DIAGNOSIS — F4322 Adjustment disorder with anxiety: Secondary | ICD-10-CM | POA: Diagnosis not present

## 2023-07-06 DIAGNOSIS — F411 Generalized anxiety disorder: Secondary | ICD-10-CM | POA: Diagnosis not present

## 2023-07-06 DIAGNOSIS — F840 Autistic disorder: Secondary | ICD-10-CM | POA: Diagnosis not present

## 2023-07-11 DIAGNOSIS — F4322 Adjustment disorder with anxiety: Secondary | ICD-10-CM | POA: Diagnosis not present

## 2023-07-13 DIAGNOSIS — F411 Generalized anxiety disorder: Secondary | ICD-10-CM | POA: Diagnosis not present

## 2023-07-13 DIAGNOSIS — F840 Autistic disorder: Secondary | ICD-10-CM | POA: Diagnosis not present

## 2023-07-18 DIAGNOSIS — F4322 Adjustment disorder with anxiety: Secondary | ICD-10-CM | POA: Diagnosis not present

## 2023-07-20 DIAGNOSIS — F840 Autistic disorder: Secondary | ICD-10-CM | POA: Diagnosis not present

## 2023-07-20 DIAGNOSIS — F411 Generalized anxiety disorder: Secondary | ICD-10-CM | POA: Diagnosis not present

## 2023-07-25 DIAGNOSIS — F4322 Adjustment disorder with anxiety: Secondary | ICD-10-CM | POA: Diagnosis not present

## 2023-07-27 DIAGNOSIS — F840 Autistic disorder: Secondary | ICD-10-CM | POA: Diagnosis not present

## 2023-07-27 DIAGNOSIS — F411 Generalized anxiety disorder: Secondary | ICD-10-CM | POA: Diagnosis not present

## 2023-08-01 DIAGNOSIS — F4322 Adjustment disorder with anxiety: Secondary | ICD-10-CM | POA: Diagnosis not present

## 2023-08-08 DIAGNOSIS — F4322 Adjustment disorder with anxiety: Secondary | ICD-10-CM | POA: Diagnosis not present

## 2023-08-10 DIAGNOSIS — F411 Generalized anxiety disorder: Secondary | ICD-10-CM | POA: Diagnosis not present

## 2023-08-10 DIAGNOSIS — F840 Autistic disorder: Secondary | ICD-10-CM | POA: Diagnosis not present

## 2023-08-15 DIAGNOSIS — F840 Autistic disorder: Secondary | ICD-10-CM | POA: Diagnosis not present

## 2023-08-17 DIAGNOSIS — F411 Generalized anxiety disorder: Secondary | ICD-10-CM | POA: Diagnosis not present

## 2023-08-17 DIAGNOSIS — F840 Autistic disorder: Secondary | ICD-10-CM | POA: Diagnosis not present

## 2023-08-22 DIAGNOSIS — F840 Autistic disorder: Secondary | ICD-10-CM | POA: Diagnosis not present

## 2023-08-24 DIAGNOSIS — F840 Autistic disorder: Secondary | ICD-10-CM | POA: Diagnosis not present

## 2023-08-24 DIAGNOSIS — F411 Generalized anxiety disorder: Secondary | ICD-10-CM | POA: Diagnosis not present

## 2023-08-29 DIAGNOSIS — F840 Autistic disorder: Secondary | ICD-10-CM | POA: Diagnosis not present

## 2023-08-31 DIAGNOSIS — F840 Autistic disorder: Secondary | ICD-10-CM | POA: Diagnosis not present

## 2023-08-31 DIAGNOSIS — F411 Generalized anxiety disorder: Secondary | ICD-10-CM | POA: Diagnosis not present

## 2023-09-05 DIAGNOSIS — F840 Autistic disorder: Secondary | ICD-10-CM | POA: Diagnosis not present

## 2023-09-07 DIAGNOSIS — F411 Generalized anxiety disorder: Secondary | ICD-10-CM | POA: Diagnosis not present

## 2023-09-07 DIAGNOSIS — F840 Autistic disorder: Secondary | ICD-10-CM | POA: Diagnosis not present

## 2023-09-12 DIAGNOSIS — F840 Autistic disorder: Secondary | ICD-10-CM | POA: Diagnosis not present

## 2023-09-14 DIAGNOSIS — F411 Generalized anxiety disorder: Secondary | ICD-10-CM | POA: Diagnosis not present

## 2023-09-14 DIAGNOSIS — F840 Autistic disorder: Secondary | ICD-10-CM | POA: Diagnosis not present

## 2023-09-19 DIAGNOSIS — F840 Autistic disorder: Secondary | ICD-10-CM | POA: Diagnosis not present

## 2023-09-21 DIAGNOSIS — F411 Generalized anxiety disorder: Secondary | ICD-10-CM | POA: Diagnosis not present

## 2023-09-21 DIAGNOSIS — F840 Autistic disorder: Secondary | ICD-10-CM | POA: Diagnosis not present

## 2023-09-26 DIAGNOSIS — F840 Autistic disorder: Secondary | ICD-10-CM | POA: Diagnosis not present

## 2023-09-28 DIAGNOSIS — F840 Autistic disorder: Secondary | ICD-10-CM | POA: Diagnosis not present

## 2023-09-28 DIAGNOSIS — F411 Generalized anxiety disorder: Secondary | ICD-10-CM | POA: Diagnosis not present

## 2023-10-03 DIAGNOSIS — F840 Autistic disorder: Secondary | ICD-10-CM | POA: Diagnosis not present

## 2023-10-10 DIAGNOSIS — F840 Autistic disorder: Secondary | ICD-10-CM | POA: Diagnosis not present

## 2023-10-12 DIAGNOSIS — F840 Autistic disorder: Secondary | ICD-10-CM | POA: Diagnosis not present

## 2023-10-12 DIAGNOSIS — F411 Generalized anxiety disorder: Secondary | ICD-10-CM | POA: Diagnosis not present

## 2023-10-13 ENCOUNTER — Encounter: Payer: Self-pay | Admitting: Gastroenterology

## 2023-10-17 DIAGNOSIS — F840 Autistic disorder: Secondary | ICD-10-CM | POA: Diagnosis not present

## 2023-10-19 DIAGNOSIS — F840 Autistic disorder: Secondary | ICD-10-CM | POA: Diagnosis not present

## 2023-10-19 DIAGNOSIS — F411 Generalized anxiety disorder: Secondary | ICD-10-CM | POA: Diagnosis not present

## 2023-10-24 DIAGNOSIS — F840 Autistic disorder: Secondary | ICD-10-CM | POA: Diagnosis not present

## 2023-10-26 DIAGNOSIS — F840 Autistic disorder: Secondary | ICD-10-CM | POA: Diagnosis not present

## 2023-10-26 DIAGNOSIS — F411 Generalized anxiety disorder: Secondary | ICD-10-CM | POA: Diagnosis not present

## 2023-10-31 DIAGNOSIS — F840 Autistic disorder: Secondary | ICD-10-CM | POA: Diagnosis not present

## 2023-11-09 DIAGNOSIS — F840 Autistic disorder: Secondary | ICD-10-CM | POA: Diagnosis not present

## 2023-11-09 DIAGNOSIS — F411 Generalized anxiety disorder: Secondary | ICD-10-CM | POA: Diagnosis not present

## 2023-11-14 DIAGNOSIS — F840 Autistic disorder: Secondary | ICD-10-CM | POA: Diagnosis not present

## 2023-11-21 DIAGNOSIS — F840 Autistic disorder: Secondary | ICD-10-CM | POA: Diagnosis not present

## 2023-11-23 DIAGNOSIS — F411 Generalized anxiety disorder: Secondary | ICD-10-CM | POA: Diagnosis not present

## 2023-11-23 DIAGNOSIS — F840 Autistic disorder: Secondary | ICD-10-CM | POA: Diagnosis not present

## 2023-11-28 DIAGNOSIS — F840 Autistic disorder: Secondary | ICD-10-CM | POA: Diagnosis not present

## 2023-11-28 DIAGNOSIS — Z0000 Encounter for general adult medical examination without abnormal findings: Secondary | ICD-10-CM | POA: Diagnosis not present

## 2023-11-28 DIAGNOSIS — Z681 Body mass index (BMI) 19 or less, adult: Secondary | ICD-10-CM | POA: Diagnosis not present

## 2023-11-28 DIAGNOSIS — B079 Viral wart, unspecified: Secondary | ICD-10-CM | POA: Diagnosis not present

## 2023-11-28 LAB — LAB REPORT - SCANNED: EGFR: 117

## 2023-11-30 DIAGNOSIS — F411 Generalized anxiety disorder: Secondary | ICD-10-CM | POA: Diagnosis not present

## 2023-11-30 DIAGNOSIS — F840 Autistic disorder: Secondary | ICD-10-CM | POA: Diagnosis not present

## 2023-12-05 DIAGNOSIS — F840 Autistic disorder: Secondary | ICD-10-CM | POA: Diagnosis not present

## 2023-12-06 ENCOUNTER — Ambulatory Visit: Admitting: Gastroenterology

## 2023-12-06 ENCOUNTER — Other Ambulatory Visit (INDEPENDENT_AMBULATORY_CARE_PROVIDER_SITE_OTHER)

## 2023-12-06 ENCOUNTER — Ambulatory Visit: Payer: Self-pay | Admitting: Gastroenterology

## 2023-12-06 ENCOUNTER — Encounter: Payer: Self-pay | Admitting: Gastroenterology

## 2023-12-06 VITALS — BP 98/62 | HR 68 | Ht 65.0 in | Wt 94.0 lb

## 2023-12-06 DIAGNOSIS — R6881 Early satiety: Secondary | ICD-10-CM

## 2023-12-06 DIAGNOSIS — D509 Iron deficiency anemia, unspecified: Secondary | ICD-10-CM

## 2023-12-06 DIAGNOSIS — R110 Nausea: Secondary | ICD-10-CM

## 2023-12-06 DIAGNOSIS — Z681 Body mass index (BMI) 19 or less, adult: Secondary | ICD-10-CM

## 2023-12-06 DIAGNOSIS — E559 Vitamin D deficiency, unspecified: Secondary | ICD-10-CM | POA: Diagnosis not present

## 2023-12-06 DIAGNOSIS — R140 Abdominal distension (gaseous): Secondary | ICD-10-CM

## 2023-12-06 DIAGNOSIS — R143 Flatulence: Secondary | ICD-10-CM

## 2023-12-06 LAB — C-REACTIVE PROTEIN: CRP: <1 mg/dL (ref 0.5–20.0)

## 2023-12-06 LAB — TSH: TSH: 1.12 u[IU]/mL (ref 0.35–5.50)

## 2023-12-06 NOTE — Patient Instructions (Signed)
 Your provider has requested that you go to the basement level for lab work before leaving today. Press B on the elevator. The lab is located at the first door on the left as you exit the elevator.  _______________________________________________________  If your blood pressure at your visit was 140/90 or greater, please contact your primary care physician to follow up on this.  _______________________________________________________  If you are age 36 or older, your body mass index should be between 23-30. Your Body mass index is 15.64 kg/m. If this is out of the aforementioned range listed, please consider follow up with your Primary Care Provider.  If you are age 20 or younger, your body mass index should be between 19-25. Your Body mass index is 15.64 kg/m. If this is out of the aformentioned range listed, please consider follow up with your Primary Care Provider.   ________________________________________________________  The [facility] providers would like to encourage you to use MYCHART to communicate with providers for non-urgent requests or questions.  Due to long hold times on the telephone, sending your provider a message by [facility] may be a faster and more efficient way to get a response.  Please allow 48 business hours for a response.  Please remember that this is for non-urgent requests.  _______________________________________________________  [facility] is using a team-based approach to care.  Your team is made up of your doctor and two to three APPS. Our APPS (Nurse Practitioners and Physician Assistants) work with your physician to ensure care continuity for you. They are fully qualified to address your health concerns and develop a treatment plan. They communicate directly with your gastroenterologist to care for you. Seeing the Advanced Practice Practitioners on your physician's team can help you by facilitating care more promptly, often allowing for earlier  appointments, access to diagnostic testing, procedures, and other specialty referrals.   Thank you for trusting me with your gastrointestinal care. Deanna May, NP-C

## 2023-12-06 NOTE — Progress Notes (Signed)
 I agree with the assessment and plan as outlined by Ms. May.

## 2023-12-06 NOTE — Progress Notes (Addendum)
 Chief Complaint:unable to gain weight, nausea, early satiety Primary GI Doctor: Dr. Federico  HPI:  Patient is a  38  year old female patient with past medical history of autism, vitamin D deficiency, iron deficiency anemia, who was self referred to me for a evaluation of   Interval History    Patient presents for evaluation of several gastrointestinal issues, accompanied by her mother. She does report intermittent episodes where she needs to burp and cannot.  Patient denies GERD symptoms. Patient denies dysphagia.    She also has intermittent nausea that can be triggered by certain foods and eating too fast. She limits starches. She reports certain foods make her gag. She reports if the food is too bland it causes her to gag. She snacks through out the day. She cannot tell me any specific foods that trigger her symptoms.She reports she feels full very quickly.     Patient reports she has bowel movement most days. She reports her stools vary from formed to soft. She has intermittent BRB with wiping but is small amount and due to rectal irritation. She has a lot of gas, worse with starches.   Normal weight is 90-95lbs. She has worked with nutritionist but states she hasn't found someone who can work with her specific issues.  No alcohol use. Nonsmoker.   Never seen by gastroenterologist.   Surgical history: wisdom teeth removed.  Patient taking sleep supplement and puts amylase in food. 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   No family history of GI issues or colon CA.   Wt Readings from Last 3 Encounters:  12/06/23 94 lb (42.6 kg)  03/08/19 92 lb (41.7 kg)  01/25/18 91 lb (41.3 kg)    Past Medical History:  Diagnosis Date   Autism     Past Surgical History:  Procedure Laterality Date   WISDOM TOOTH EXTRACTION      Current Outpatient Medications  Medication Sig Dispense Refill   ibuprofen  (ADVIL ) 600 MG tablet Take 1 tablet (600 mg total) by mouth every 6  (six) hours as needed. 30 tablet 0   No current facility-administered medications for this visit.    Allergies as of 12/06/2023 - Review Complete 12/06/2023  Allergen Reaction Noted   Erythromycin Other (See Comments) 05/11/2017    Family History  Problem Relation Age of Onset   Breast cancer Maternal Grandmother    Esophageal cancer Neg Hx    Liver disease Neg Hx    Colon cancer Neg Hx     Review of Systems:    Constitutional: No weight loss, fever, chills, weakness or fatigue HEENT: Eyes: No change in vision               Ears, Nose, Throat:  No change in hearing or congestion Skin: No rash or itching Cardiovascular: No chest pain, chest pressure or palpitations   Respiratory: No SOB or cough Gastrointestinal: See HPI and otherwise negative Genitourinary: No dysuria or change in urinary frequency Neurological: No headache, dizziness or syncope Musculoskeletal: No new muscle or joint pain Hematologic: No bleeding or bruising Psychiatric: No history of depression or anxiety    Physical Exam:  Vital signs: BP 98/62   Pulse 68   Ht 5' 5 (1.651 m)   Wt 94 lb (42.6 kg)   BMI 15.64 kg/m   Constitutional:   Pleasant  female sitting in fetal position on exam table, alert and cooperative Neck:  Supple Throat: Oral cavity and pharynx without inflammation,  swelling or lesion.  Respiratory: Respirations even and unlabored. Lungs clear to auscultation bilaterally.   No wheezes, crackles, or rhonchi.  Cardiovascular: Normal S1, S2. Regular rate and rhythm. No peripheral edema, cyanosis or pallor.  Gastrointestinal:  Soft, nondistended, lower abdominal tenderness with palpation. No rebound or guarding. Normal bowel sounds. No appreciable masses or hepatomegaly. Rectal:  Not performed. Declined. Msk:  Symmetrical without gross deformities. Without edema, no deformity or joint abnormality.  Neurologic:  Alert and  oriented x4;   Skin:   Dry scabs noted to face, chest  RELEVANT  LABS AND IMAGING: CBC    Latest Ref Rng & Units 03/09/2019    7:17 AM 03/09/2019    5:31 AM  CBC  WBC 4.0 - 10.5 K/uL  7.2   Hemoglobin 12.0 - 15.0 g/dL 87.0  88.4   Hematocrit 36.0 - 46.0 % 38.0  37.0   Platelets 150 - 400 K/uL  220      CMP     Latest Ref Rng & Units 03/09/2019    7:17 AM 03/09/2019    5:31 AM  CMP  Glucose 70 - 99 mg/dL 75    BUN 6 - 20 mg/dL 14    Creatinine 9.55 - 1.00 mg/dL 9.49    Sodium 864 - 854 mmol/L 142    Potassium 3.5 - 5.1 mmol/L 3.4    Chloride 98 - 111 mmol/L 107    Total Protein 6.5 - 8.1 g/dL  7.4   Total Bilirubin 0.3 - 1.2 mg/dL  0.7   Alkaline Phos 38 - 126 U/L  47   AST 15 - 41 U/L  13   ALT 0 - 44 U/L  12   Labs brought from mom dates dated 11/28/2023: Vitamin D22.2, B12 352, iron level 40, iron saturation 10, BUN 20, creatinine 0.64, normal electrolytes, normal LFTs  02/2019 CTAP IMPRESSION: 1. Cystic mass in the posterior right pelvis, likely arising from the right ovary measuring 5.3 x 5.0 cm. This cystic mass is a slightly thickened wall and mild surrounding fluid. Question recent ovarian cyst rupture. Early ovarian torsion could present in this manner; pelvic ultrasound with Doppler assessment advised in this regard.   2. Smaller left ovarian cyst, Annette Bertelson represent a dominant follicle. Minimal surrounding fluid is seen in this area as well. There Xan Ingraham have been recent left ovarian cyst rupture.   3. Ascites tracks into the anterior pelvis and partially surrounds the bladder. Question fluid from the suspected ovarian cyst leakage/rupture or posteriorly. Minimal ascites is seen adjacent to the gallbladder in the right upper abdomen.   4. Appendix appears unremarkable. No bowel obstruction. No abscess in the abdomen or pelvis.   5. Mildly prominent venous structures between the left kidney and spleen. No similar findings elsewhere. Etiology for this venous prominence is uncertain. Spleen is normal in size and  contour.  Assessment: Encounter Diagnoses  Name Primary?   Early satiety Yes   Nausea without vomiting    BMI less than 19,adult    Vitamin D deficiency    Iron deficiency anemia, unspecified iron deficiency anemia type    Flatulence    Bloating     38 year old female patient with a history of autism who presents with early syncope, nausea, flatulence and bloating.  Patient does note that she avoids certain foods that trigger her to gag.  Patient has seen dietitian which she did not find helpful.  Most recent labs showed she was deficient in vitamin D and iron.  When discussing taking oral supplementation she told me she did not like to take oral medication and has been trying to incorporate it into her diet.  I also offered her IV iron infusion as another option, patient declined. I suspect this Bonney Berres partially be due to poor oral intake but given that she does have GI symptoms recommended we look with upper GI endoscopy and small bowel biopsy. She declined. Will go ahead and order lab work to rule out celiac disease, inflammatory disease, and/or thyroid  disease.  We also discussed imaging versus upper GI endoscopy.  Patient tells me that she has had ultrasounds in the past that were very bothersome.   She did agree to gastric emptying study to rule out motility issue if they can accommodate her with certain food choices to do the testing.  Evaristo Tsuda also consider upper GI series with small bowel follow-through as another option to evaluate the GI tract.   Plan: -Check CRP, TTG IgA, IgA, TSH -declined IV iron infusion  -gastric emptying test 4 hour -call about food  --recommend EGD, patient declined. Tony Granquist consider An Upper GI (gastrointestinal) with Small Bowel Follow Through if negative workup   Thank you for the courtesy of this consult. Please call me with any questions or concerns.   Tonda Wiederhold, FNP-C [facility] 12/06/2023, 12:48 PM  Cc: Loretha Richerd SAUNDERS, MD

## 2023-12-07 DIAGNOSIS — F411 Generalized anxiety disorder: Secondary | ICD-10-CM | POA: Diagnosis not present

## 2023-12-07 DIAGNOSIS — F840 Autistic disorder: Secondary | ICD-10-CM | POA: Diagnosis not present

## 2023-12-08 LAB — IGA: Immunoglobulin A: 153 mg/dL (ref 47–310)

## 2023-12-08 LAB — TISSUE TRANSGLUTAMINASE ABS,IGG,IGA
(tTG) Ab, IgA: <1 U/mL
(tTG) Ab, IgG: <1 U/mL

## 2023-12-12 DIAGNOSIS — F840 Autistic disorder: Secondary | ICD-10-CM | POA: Diagnosis not present

## 2023-12-14 DIAGNOSIS — F411 Generalized anxiety disorder: Secondary | ICD-10-CM | POA: Diagnosis not present

## 2023-12-14 DIAGNOSIS — F840 Autistic disorder: Secondary | ICD-10-CM | POA: Diagnosis not present

## 2023-12-19 DIAGNOSIS — F840 Autistic disorder: Secondary | ICD-10-CM | POA: Diagnosis not present

## 2023-12-21 DIAGNOSIS — F840 Autistic disorder: Secondary | ICD-10-CM | POA: Diagnosis not present

## 2023-12-21 DIAGNOSIS — F411 Generalized anxiety disorder: Secondary | ICD-10-CM | POA: Diagnosis not present

## 2023-12-26 DIAGNOSIS — F840 Autistic disorder: Secondary | ICD-10-CM | POA: Diagnosis not present

## 2023-12-28 DIAGNOSIS — F411 Generalized anxiety disorder: Secondary | ICD-10-CM | POA: Diagnosis not present

## 2023-12-28 DIAGNOSIS — F840 Autistic disorder: Secondary | ICD-10-CM | POA: Diagnosis not present

## 2024-01-02 DIAGNOSIS — F840 Autistic disorder: Secondary | ICD-10-CM | POA: Diagnosis not present

## 2024-01-04 DIAGNOSIS — F840 Autistic disorder: Secondary | ICD-10-CM | POA: Diagnosis not present

## 2024-01-09 DIAGNOSIS — F840 Autistic disorder: Secondary | ICD-10-CM | POA: Diagnosis not present

## 2024-01-11 DIAGNOSIS — F840 Autistic disorder: Secondary | ICD-10-CM | POA: Diagnosis not present

## 2024-01-16 DIAGNOSIS — F840 Autistic disorder: Secondary | ICD-10-CM | POA: Diagnosis not present

## 2024-01-23 DIAGNOSIS — F840 Autistic disorder: Secondary | ICD-10-CM | POA: Diagnosis not present

## 2024-01-25 DIAGNOSIS — F840 Autistic disorder: Secondary | ICD-10-CM | POA: Diagnosis not present

## 2024-01-30 DIAGNOSIS — F840 Autistic disorder: Secondary | ICD-10-CM | POA: Diagnosis not present

## 2024-02-01 DIAGNOSIS — F840 Autistic disorder: Secondary | ICD-10-CM | POA: Diagnosis not present

## 2024-02-06 DIAGNOSIS — F840 Autistic disorder: Secondary | ICD-10-CM | POA: Diagnosis not present

## 2024-02-08 DIAGNOSIS — F840 Autistic disorder: Secondary | ICD-10-CM | POA: Diagnosis not present

## 2024-02-13 DIAGNOSIS — F840 Autistic disorder: Secondary | ICD-10-CM | POA: Diagnosis not present

## 2024-02-20 DIAGNOSIS — F840 Autistic disorder: Secondary | ICD-10-CM | POA: Diagnosis not present

## 2024-02-27 DIAGNOSIS — F840 Autistic disorder: Secondary | ICD-10-CM | POA: Diagnosis not present

## 2024-02-29 DIAGNOSIS — F840 Autistic disorder: Secondary | ICD-10-CM | POA: Diagnosis not present

## 2024-03-05 DIAGNOSIS — F840 Autistic disorder: Secondary | ICD-10-CM | POA: Diagnosis not present

## 2024-03-07 DIAGNOSIS — F840 Autistic disorder: Secondary | ICD-10-CM | POA: Diagnosis not present

## 2024-03-12 DIAGNOSIS — F840 Autistic disorder: Secondary | ICD-10-CM | POA: Diagnosis not present

## 2024-03-14 DIAGNOSIS — F840 Autistic disorder: Secondary | ICD-10-CM | POA: Diagnosis not present

## 2024-03-19 DIAGNOSIS — F840 Autistic disorder: Secondary | ICD-10-CM | POA: Diagnosis not present

## 2024-03-21 DIAGNOSIS — F840 Autistic disorder: Secondary | ICD-10-CM | POA: Diagnosis not present

## 2024-03-26 DIAGNOSIS — F840 Autistic disorder: Secondary | ICD-10-CM | POA: Diagnosis not present

## 2024-03-28 DIAGNOSIS — F840 Autistic disorder: Secondary | ICD-10-CM | POA: Diagnosis not present

## 2024-04-04 DIAGNOSIS — F840 Autistic disorder: Secondary | ICD-10-CM | POA: Diagnosis not present

## 2024-04-09 DIAGNOSIS — F840 Autistic disorder: Secondary | ICD-10-CM | POA: Diagnosis not present

## 2024-04-16 DIAGNOSIS — F840 Autistic disorder: Secondary | ICD-10-CM | POA: Diagnosis not present

## 2024-04-18 DIAGNOSIS — F840 Autistic disorder: Secondary | ICD-10-CM | POA: Diagnosis not present

## 2024-04-23 DIAGNOSIS — F840 Autistic disorder: Secondary | ICD-10-CM | POA: Diagnosis not present

## 2024-04-25 DIAGNOSIS — F840 Autistic disorder: Secondary | ICD-10-CM | POA: Diagnosis not present

## 2024-04-30 DIAGNOSIS — F840 Autistic disorder: Secondary | ICD-10-CM | POA: Diagnosis not present

## 2024-05-02 DIAGNOSIS — F840 Autistic disorder: Secondary | ICD-10-CM | POA: Diagnosis not present

## 2024-05-02 DIAGNOSIS — H35413 Lattice degeneration of retina, bilateral: Secondary | ICD-10-CM | POA: Diagnosis not present

## 2024-05-02 DIAGNOSIS — H04123 Dry eye syndrome of bilateral lacrimal glands: Secondary | ICD-10-CM | POA: Diagnosis not present
